# Patient Record
Sex: Female | Born: 1970 | Race: White | Hispanic: No | Smoking: Current every day smoker
Health system: Southern US, Community
[De-identification: ages and names within clinical notes are randomized; demographics above are authoritative.]

## PROBLEM LIST (undated history)

## (undated) DIAGNOSIS — K5792 Diverticulitis of intestine, part unspecified, without perforation or abscess without bleeding: Secondary | ICD-10-CM

## (undated) DIAGNOSIS — G510 Bell's palsy: Secondary | ICD-10-CM

## (undated) HISTORY — PX: CHOLECYSTECTOMY: SHX55

## (undated) HISTORY — PX: ABDOMINAL HYSTERECTOMY: SHX81

---

## 2004-10-07 ENCOUNTER — Ambulatory Visit: Payer: Self-pay | Admitting: Family Medicine

## 2009-03-15 ENCOUNTER — Ambulatory Visit (HOSPITAL_COMMUNITY): Admission: RE | Admit: 2009-03-15 | Discharge: 2009-03-15 | Payer: Self-pay | Admitting: Obstetrics and Gynecology

## 2009-04-29 ENCOUNTER — Ambulatory Visit (HOSPITAL_COMMUNITY): Admission: RE | Admit: 2009-04-29 | Discharge: 2009-04-29 | Payer: Self-pay | Admitting: Obstetrics and Gynecology

## 2009-05-23 ENCOUNTER — Ambulatory Visit (HOSPITAL_COMMUNITY): Admission: RE | Admit: 2009-05-23 | Discharge: 2009-05-23 | Payer: Self-pay | Admitting: Obstetrics and Gynecology

## 2009-06-20 ENCOUNTER — Ambulatory Visit (HOSPITAL_COMMUNITY): Admission: RE | Admit: 2009-06-20 | Discharge: 2009-06-20 | Payer: Self-pay | Admitting: Obstetrics and Gynecology

## 2009-07-18 ENCOUNTER — Ambulatory Visit (HOSPITAL_COMMUNITY): Admission: RE | Admit: 2009-07-18 | Discharge: 2009-07-18 | Payer: Self-pay | Admitting: Obstetrics and Gynecology

## 2010-03-05 ENCOUNTER — Encounter: Payer: Self-pay | Admitting: Obstetrics and Gynecology

## 2016-03-06 ENCOUNTER — Emergency Department: Payer: Self-pay

## 2016-03-06 ENCOUNTER — Encounter: Payer: Self-pay | Admitting: Emergency Medicine

## 2016-03-06 ENCOUNTER — Emergency Department
Admission: EM | Admit: 2016-03-06 | Discharge: 2016-03-06 | Disposition: A | Discharge status: Home / Self Care | Payer: Self-pay | Attending: Emergency Medicine | Admitting: Emergency Medicine

## 2016-03-06 ENCOUNTER — Emergency Department
Admission: EM | Admit: 2016-03-06 | Discharge: 2016-03-07 | Disposition: A | Discharge status: Home / Self Care | Payer: Medicaid Other | Attending: Emergency Medicine | Admitting: Emergency Medicine

## 2016-03-06 DIAGNOSIS — R42 Dizziness and giddiness: Secondary | ICD-10-CM | POA: Insufficient documentation

## 2016-03-06 DIAGNOSIS — R0789 Other chest pain: Secondary | ICD-10-CM | POA: Insufficient documentation

## 2016-03-06 DIAGNOSIS — F17210 Nicotine dependence, cigarettes, uncomplicated: Secondary | ICD-10-CM | POA: Insufficient documentation

## 2016-03-06 DIAGNOSIS — R079 Chest pain, unspecified: Secondary | ICD-10-CM | POA: Insufficient documentation

## 2016-03-06 HISTORY — DX: Diverticulitis of intestine, part unspecified, without perforation or abscess without bleeding: K57.92

## 2016-03-06 LAB — COMPREHENSIVE METABOLIC PANEL
ALT: 17 U/L (ref 14–54)
ANION GAP: 5 (ref 5–15)
AST: 16 U/L (ref 15–41)
Albumin: 3.8 g/dL (ref 3.5–5.0)
Alkaline Phosphatase: 46 U/L (ref 38–126)
BUN: 14 mg/dL (ref 6–20)
CALCIUM: 8.7 mg/dL — AB (ref 8.9–10.3)
CHLORIDE: 108 mmol/L (ref 101–111)
CO2: 27 mmol/L (ref 22–32)
Creatinine, Ser: 0.69 mg/dL (ref 0.44–1.00)
GFR calc non Af Amer: >60 mL/min (ref >60)
Glucose, Bld: 95 mg/dL (ref 65–99)
Potassium: 3.9 mmol/L (ref 3.5–5.1)
SODIUM: 140 mmol/L (ref 135–145)
Total Bilirubin: 0.6 mg/dL (ref 0.3–1.2)
Total Protein: 6.3 g/dL — ABNORMAL LOW (ref 6.5–8.1)

## 2016-03-06 LAB — BASIC METABOLIC PANEL
ANION GAP: 7 (ref 5–15)
BUN: 14 mg/dL (ref 6–20)
CO2: 28 mmol/L (ref 22–32)
Calcium: 9 mg/dL (ref 8.9–10.3)
Chloride: 105 mmol/L (ref 101–111)
Creatinine, Ser: 0.8 mg/dL (ref 0.44–1.00)
GLUCOSE: 103 mg/dL — AB (ref 65–99)
POTASSIUM: 3 mmol/L — AB (ref 3.5–5.1)
Sodium: 140 mmol/L (ref 135–145)

## 2016-03-06 LAB — PROTIME-INR
INR: 1.01
Prothrombin Time: 13.3 seconds (ref 11.4–15.2)

## 2016-03-06 LAB — BRAIN NATRIURETIC PEPTIDE: B NATRIURETIC PEPTIDE 5: 14 pg/mL (ref 0.0–100.0)

## 2016-03-06 LAB — CBC
HCT: 40.1 % (ref 35.0–47.0)
HEMATOCRIT: 41.9 % (ref 35.0–47.0)
HEMOGLOBIN: 14.7 g/dL (ref 12.0–16.0)
Hemoglobin: 14 g/dL (ref 12.0–16.0)
MCH: 31.4 pg (ref 26.0–34.0)
MCH: 31.8 pg (ref 26.0–34.0)
MCHC: 34.9 g/dL (ref 32.0–36.0)
MCHC: 35.1 g/dL (ref 32.0–36.0)
MCV: 90.2 fL (ref 80.0–100.0)
MCV: 90.6 fL (ref 80.0–100.0)
PLATELETS: 166 10*3/uL (ref 150–440)
Platelets: 179 10*3/uL (ref 150–440)
RBC: 4.44 MIL/uL (ref 3.80–5.20)
RBC: 4.63 MIL/uL (ref 3.80–5.20)
RDW: 13.3 % (ref 11.5–14.5)
RDW: 13.2 % (ref 11.5–14.5)
WBC: 5.4 10*3/uL (ref 3.6–11.0)
WBC: 7.5 10*3/uL (ref 3.6–11.0)

## 2016-03-06 LAB — FIBRIN DERIVATIVES D-DIMER (ARMC ONLY): FIBRIN DERIVATIVES D-DIMER (ARMC): 300 (ref 0–499)

## 2016-03-06 LAB — TROPONIN I: Troponin I: <0.03 ng/mL (ref <0.03)

## 2016-03-06 LAB — APTT: APTT: 30 s (ref 24–36)

## 2016-03-06 MED ORDER — ONDANSETRON HCL 4 MG/2ML IJ SOLN
4.0000 mg | Freq: Once | INTRAMUSCULAR | Status: AC
  Administered 2016-03-06: 4 mg via INTRAVENOUS
  Filled 2016-03-06: qty 2

## 2016-03-06 MED ORDER — ONDANSETRON 4 MG PO TBDP: 4.0000 mg | ORAL_TABLET | Freq: Three times a day (TID) | ORAL | 0 refills | Status: DC | PRN

## 2016-03-06 MED ORDER — IOPAMIDOL (ISOVUE-370) INJECTION 76%
100.0000 mL | Freq: Once | INTRAVENOUS | Status: AC | PRN
  Administered 2016-03-06: 100 mL via INTRAVENOUS

## 2016-03-06 MED ORDER — MORPHINE SULFATE (PF) 2 MG/ML IV SOLN
2.0000 mg | Freq: Once | INTRAVENOUS | Status: AC
  Administered 2016-03-06: 2 mg via INTRAVENOUS
  Filled 2016-03-06: qty 1

## 2016-03-06 MED ORDER — NITROGLYCERIN 0.4 MG SL SUBL
0.4000 mg | SUBLINGUAL_TABLET | Freq: Once | SUBLINGUAL | Status: AC
Start: 2016-03-06 — End: 2016-03-06
  Administered 2016-03-06: 0.4 mg via SUBLINGUAL
  Filled 2016-03-06: qty 1

## 2016-03-06 MED ORDER — KETOROLAC TROMETHAMINE 30 MG/ML IJ SOLN
30.0000 mg | Freq: Once | INTRAMUSCULAR | Status: AC
  Administered 2016-03-06: 30 mg via INTRAVENOUS
  Filled 2016-03-06: qty 1

## 2016-03-06 NOTE — ED Provider Notes (Signed)
[facility] Emergency Department Provider Note   ____________________________________________    I have reviewed the triage vital signs and the nursing notes.   HISTORY  Chief Complaint Chest Pain     HPI Hannah Bowman is a 46 y.o. female presented with right-sided upper chest pain which radiates down her right arm and into her neck. She reports this started proximal leg one hour ago at work. She then developed nausea and vomiting thereafter. She reports a strong family history of heart disease. She denies a history of high blood pressure, she does smoke cigarettes. Worsening pain with deep inspiration. No recent travel. No Swelling. No history of blood clots.   Past Medical History:  Diagnosis Date  . Diverticulitis     There are no active problems to display for this patient.   Past Surgical History:  Procedure Laterality Date  . ABDOMINAL HYSTERECTOMY    . CESAREAN SECTION    . CHOLECYSTECTOMY      Prior to Admission medications   Medication Sig Start Date End Date Taking? Authorizing Provider  cholecalciferol (VITAMIN D) 400 units TABS tablet Take 400 Units by mouth daily.   Yes Historical Provider, MD     Allergies Patient has no known allergies.  No family history on file.  Social History Social History  Substance Use Topics  . Smoking status: Current Every Day Smoker    Packs/day: 1.00    Types: Cigarettes  . Smokeless tobacco: Never Used  . Alcohol use No    Review of Systems  Constitutional: No fever/chills Eyes: No visual changes.   Cardiovascular: As above Respiratory: Denies shortness of breath. Positive pleurisy Gastrointestinal: No abdominal pain.  No nausea, no vomiting.    Musculoskeletal: Negative for back pain. Skin: Negative for rash. Neurological: Negative for headaches   10-point ROS otherwise negative.  ____________________________________________   PHYSICAL EXAM:  VITAL SIGNS: ED  Triage Vitals  Enc Vitals Group     BP      Pulse      Resp      Temp      Temp src      SpO2      Weight      Height      Head Circumference      Peak Flow      Pain Score      Pain Loc      Pain Edu?      Excl. in GC?     Constitutional: Alert and oriented.Uncomfortable but pleasant and interactive Eyes: Conjunctivae are normal.   Nose: No congestion/rhinnorhea. Mouth/Throat: Mucous membranes are moist.   Neck:  Painless ROM Cardiovascular: Normal rate, regular rhythm. Grossly normal heart sounds.  Good peripheral circulation. Respiratory: Normal respiratory effort.  No retractions. Lungs CTAB. Gastrointestinal: Soft and nontender. No distention.  No CVA tenderness. Genitourinary: deferred Musculoskeletal: No lower extremity tenderness nor edema.  Warm and well perfused Neurologic:  Normal speech and language. No gross focal neurologic deficits are appreciated.  Skin:  Skin is warm, dry and intact. No rash noted. Psychiatric: Mood and affect are normal. Speech and behavior are normal.  ____________________________________________   LABS (all labs ordered are listed, but only abnormal results are displayed)  Labs Reviewed  COMPREHENSIVE METABOLIC PANEL - Abnormal; Notable for the following:       Result Value   Calcium 8.7 (*)    Total Protein 6.3 (*)    All other components within normal limits  CBC  TROPONIN I  BRAIN NATRIURETIC PEPTIDE  FIBRIN DERIVATIVES D-DIMER ([facility] ONLY)  PROTIME-INR  APTT  TROPONIN I   ____________________________________________  EKG  ED ECG REPORT I, Jene Every, the attending physician, personally viewed and interpreted this ECG.  Date: 03/06/2016 EKG Time: 10:18 AM Rate: 63  Rhythm: normal sinus rhythm QRS Axis: normal Intervals: normal ST/T Wave abnormalities: normal Conduction Disturbances: none Narrative Interpretation: unremarkable  ____________________________________________  RADIOLOGY  Normal chest  x-ray ____________________________________________   PROCEDURES  Procedure(s) performed: No    Critical Care performed: No ____________________________________________   INITIAL IMPRESSION / ASSESSMENT AND PLAN / ED COURSE  Pertinent labs & imaging results that were available during my care of the patient were reviewed by me and considered in my medical decision making (see chart for details).  Patient presents with right sided upper chest pain radiating to right arm followed by nausea and vomiting and pleurisy. Differential includes ACS, PE, dissection, musculoskeletal pain, pneumonia     ----------------------------------------- 2:53 PM on 03/06/2016 ----------------------------------------- Patient reports she is feeling significantly better. Her CT angio is unremarkable. We will check a second troponin and if negative feel she is appropriate for discharge with close follow-up with cardiology. She knows to avoid exertion until cleared by cardiology. Return precautions discussed at length.  ____________________________________________   FINAL CLINICAL IMPRESSION(S) / ED DIAGNOSES  Final diagnoses:  Atypical chest pain      NEW MEDICATIONS STARTED DURING THIS VISIT:  New Prescriptions   No medications on file     Note:  This document was prepared using Dragon voice recognition software and may include unintentional dictation errors.    Jene Every, MD 03/06/16 1538

## 2016-03-06 NOTE — ED Triage Notes (Signed)
Pt presents to ED via wheelchair with c/o RIGHT and MID chest pain accompanied by dizziness and headache. Pt reports was seen here this morning, results were negative. Pt states pain became more intense tonight. Pt denies shortness of breath, nausea, vomiting, or back pain. Pt alert and oriented x 4, respirations even and unlabored. Skin warm and dry.

## 2016-03-06 NOTE — ED Triage Notes (Signed)
Per [facility], patient here from work after c/o left sided CP, right arm pain and right jaw pain 1 hour PTA. Patient also c/o nausea with 1 episode of vomiting 30 mins PTA. Patient A&O x4. Ambulatory on scene. No cardiac history noted. EMS reports SB with a rate of 42. Currently patient SB rate of 59 on monitor. Patient still reports 8/10 CP.

## 2016-03-07 LAB — TROPONIN I: Troponin I: <0.03 ng/mL (ref <0.03)

## 2016-03-07 MED ORDER — TRAMADOL HCL 50 MG PO TABS: 50.0000 mg | ORAL_TABLET | Freq: Four times a day (QID) | ORAL | 0 refills | Status: DC | PRN

## 2016-03-07 MED ORDER — SODIUM CHLORIDE 0.9 % IV BOLUS (SEPSIS)
1000.0000 mL | Freq: Once | INTRAVENOUS | Status: AC
  Administered 2016-03-07: 1000 mL via INTRAVENOUS

## 2016-03-07 MED ORDER — TRAMADOL HCL 50 MG PO TABS
50.0000 mg | ORAL_TABLET | Freq: Once | ORAL | Status: AC
  Administered 2016-03-07: 50 mg via ORAL
  Filled 2016-03-07: qty 1

## 2016-03-07 MED ORDER — POTASSIUM CHLORIDE CRYS ER 20 MEQ PO TBCR
40.0000 meq | EXTENDED_RELEASE_TABLET | Freq: Once | ORAL | Status: AC
  Administered 2016-03-07: 40 meq via ORAL
  Filled 2016-03-07: qty 2

## 2016-03-07 NOTE — ED Provider Notes (Signed)
[facility] Emergency Department Provider Note   ____________________________________________   First MD Initiated Contact with Patient 03/06/16 2349     (approximate)  I have reviewed the triage vital signs and the nursing notes.   HISTORY  Chief Complaint Chest Pain    HPI Hannah NevinsHeather B Jelinski is a 46 y.o. female who comes into the hospital today with some chest pain. The patient was actually seen here in the emergency department earlier. She was told that if her symptoms came back and was worse she should come back into the hospital. She had some right-sided chest pain and she reports she became dizzy and lightheaded. Her hands were tingling like they were asleep when she was walking. She reports that the dull pain when she lays on her back. The patient denies any shortness of breath. She reports that the pain is worse with deep inspiration and she rates her pain a 6 out of 10 in intensity. The patient has never had this pain before. She vomited earlier and has had some nausea. The patient has back here this evening for further evaluation of these symptoms.   Past Medical History:  Diagnosis Date  . Diverticulitis     There are no active problems to display for this patient.   Past Surgical History:  Procedure Laterality Date  . ABDOMINAL HYSTERECTOMY    . CESAREAN SECTION    . CHOLECYSTECTOMY      Prior to Admission medications   Medication Sig Start Date End Date Taking? Authorizing Provider  cholecalciferol (VITAMIN D) 400 units TABS tablet Take 400 Units by mouth daily.    Historical Provider, MD  ondansetron (ZOFRAN ODT) 4 MG disintegrating tablet Take 1 tablet (4 mg total) by mouth every 8 (eight) hours as needed for nausea or vomiting. 03/06/16   Jene Everyobert Kinner, MD  traMADol (ULTRAM) 50 MG tablet Take 1 tablet (50 mg total) by mouth every 6 (six) hours as needed. 03/07/16   Rebecka ApleyAllison P Harlynn Kimbell, MD    Allergies Iodine  No family history on  file.  Social History Social History  Substance Use Topics  . Smoking status: Current Every Day Smoker    Packs/day: 1.00    Types: Cigarettes  . Smokeless tobacco: Never Used  . Alcohol use No    Review of Systems Constitutional: No fever/chills Eyes: No visual changes. ENT: No sore throat. Cardiovascular:  chest pain. Respiratory: Denies shortness of breath. Gastrointestinal: Nausea and vomiting No abdominal pain.  No diarrhea.  No constipation. Genitourinary: Negative for dysuria. Musculoskeletal: Negative for back pain. Skin: Negative for rash. Neurological: Dizziness  10-point ROS otherwise negative.  ____________________________________________   PHYSICAL EXAM:  VITAL SIGNS: ED Triage Vitals  Enc Vitals Group     BP 03/06/16 2136 103/65     Pulse Rate 03/06/16 2136 (!) 57     Resp 03/06/16 2136 18     Temp 03/06/16 2136 97.5 F (36.4 C)     Temp Source 03/06/16 2136 Oral     SpO2 03/06/16 2136 99 %     Weight 03/06/16 2136 240 lb (108.9 kg)     Height 03/06/16 2136 5\' 4"  (1.626 m)     Head Circumference --      Peak Flow --      Pain Score 03/06/16 2134 10     Pain Loc --      Pain Edu? --      Excl. in GC? --     Constitutional: Alert  and oriented. Well appearing and in Mild distress. Eyes: Conjunctivae are normal. PERRL. EOMI. Head: Atraumatic. Nose: No congestion/rhinnorhea. Mouth/Throat: Mucous membranes are moist.  Oropharynx non-erythematous. Cardiovascular: Normal rate, regular rhythm. Grossly normal heart sounds.  Good peripheral circulation. Respiratory: Normal respiratory effort.  No retractions. Lungs CTAB. Right chest tender to palpation Gastrointestinal: Soft and nontender. No distention. Positive bowel sounds Musculoskeletal: No lower extremity tenderness nor edema.   Neurologic:  Normal speech and language.  Skin:  Skin is warm, dry and intact.  Psychiatric: Mood and affect are normal.    ____________________________________________   LABS (all labs ordered are listed, but only abnormal results are displayed)  Labs Reviewed  BASIC METABOLIC PANEL - Abnormal; Notable for the following:       Result Value   Potassium 3.0 (*)    Glucose, Bld 103 (*)    All other components within normal limits  CBC  TROPONIN I  TROPONIN I   ____________________________________________  EKG  ED ECG REPORT I, Rebecka Apley, the attending physician, personally viewed and interpreted this ECG.   Date: 03/06/2016  EKG Time: 2136  Rate: 51  Rhythm: sinus bradycardia  Axis: normal  Intervals:none  ST&T Change: none  ____________________________________________  RADIOLOGY  none ____________________________________________   PROCEDURES  Procedure(s) performed: None  Procedures  Critical Care performed: No  ____________________________________________   INITIAL IMPRESSION / ASSESSMENT AND PLAN / ED COURSE  Pertinent labs & imaging results that were available during my care of the patient were reviewed by me and considered in my medical decision making (see chart for details).  This is a 46 year old female who comes into the hospital today with some right-sided chest pain. The patient was seen in the hospital earlier today for the same symptoms. She did receive a CT angios of her chest as well as a chest x-ray and a CT angiogram of her neck. The patient had no PE no dissection had some atelectasis on the right lung but nothing else. The patient's blood work earlier was also unremarkable. I did give the patient a dose of tramadol for her pain. She was sleeping while she was in the emergency department. I checked to cardiac enzymes and they were negative. At this point the patient had a workup earlier as well as a workup at this time and I'm unsure the cause of her symptoms. I encouraged patient to follow-up with cardiology for further evaluation. The patient will be  discharged home.      ____________________________________________   FINAL CLINICAL IMPRESSION(S) / ED DIAGNOSES  Final diagnoses:  Chest pain, unspecified type      NEW MEDICATIONS STARTED DURING THIS VISIT:  New Prescriptions   TRAMADOL (ULTRAM) 50 MG TABLET    Take 1 tablet (50 mg total) by mouth every 6 (six) hours as needed.     Note:  This document was prepared using Dragon voice recognition software and may include unintentional dictation errors.    Rebecka Apley, MD 03/07/16 [phone]

## 2016-03-22 ENCOUNTER — Observation Stay
Admission: EM | Admit: 2016-03-22 | Discharge: 2016-03-23 | Disposition: A | Discharge status: Home / Self Care | Payer: Self-pay | Attending: Internal Medicine | Admitting: Internal Medicine

## 2016-03-22 ENCOUNTER — Emergency Department: Payer: Self-pay

## 2016-03-22 DIAGNOSIS — Z90710 Acquired absence of both cervix and uterus: Secondary | ICD-10-CM | POA: Insufficient documentation

## 2016-03-22 DIAGNOSIS — E669 Obesity, unspecified: Secondary | ICD-10-CM | POA: Insufficient documentation

## 2016-03-22 DIAGNOSIS — R42 Dizziness and giddiness: Secondary | ICD-10-CM

## 2016-03-22 DIAGNOSIS — Z7982 Long term (current) use of aspirin: Secondary | ICD-10-CM | POA: Insufficient documentation

## 2016-03-22 DIAGNOSIS — Z79899 Other long term (current) drug therapy: Secondary | ICD-10-CM | POA: Insufficient documentation

## 2016-03-22 DIAGNOSIS — R0789 Other chest pain: Principal | ICD-10-CM | POA: Insufficient documentation

## 2016-03-22 DIAGNOSIS — R001 Bradycardia, unspecified: Secondary | ICD-10-CM

## 2016-03-22 DIAGNOSIS — R079 Chest pain, unspecified: Secondary | ICD-10-CM | POA: Diagnosis present

## 2016-03-22 DIAGNOSIS — Z6838 Body mass index (BMI) 38.0-38.9, adult: Secondary | ICD-10-CM | POA: Insufficient documentation

## 2016-03-22 DIAGNOSIS — Z91041 Radiographic dye allergy status: Secondary | ICD-10-CM | POA: Insufficient documentation

## 2016-03-22 DIAGNOSIS — Z9049 Acquired absence of other specified parts of digestive tract: Secondary | ICD-10-CM | POA: Insufficient documentation

## 2016-03-22 DIAGNOSIS — E785 Hyperlipidemia, unspecified: Secondary | ICD-10-CM | POA: Insufficient documentation

## 2016-03-22 DIAGNOSIS — Z8249 Family history of ischemic heart disease and other diseases of the circulatory system: Secondary | ICD-10-CM | POA: Insufficient documentation

## 2016-03-22 DIAGNOSIS — F17210 Nicotine dependence, cigarettes, uncomplicated: Secondary | ICD-10-CM | POA: Insufficient documentation

## 2016-03-22 LAB — CBC
HEMATOCRIT: 42.2 % (ref 35.0–47.0)
Hemoglobin: 14.4 g/dL (ref 12.0–16.0)
MCH: 31 pg (ref 26.0–34.0)
MCHC: 34.1 g/dL (ref 32.0–36.0)
MCV: 91.1 fL (ref 80.0–100.0)
PLATELETS: 174 10*3/uL (ref 150–440)
RBC: 4.63 MIL/uL (ref 3.80–5.20)
RDW: 13.2 % (ref 11.5–14.5)
WBC: 5.1 10*3/uL (ref 3.6–11.0)

## 2016-03-22 LAB — COMPREHENSIVE METABOLIC PANEL
ALBUMIN: 4.2 g/dL (ref 3.5–5.0)
ALT: 16 U/L (ref 14–54)
ANION GAP: 7 (ref 5–15)
AST: 17 U/L (ref 15–41)
Alkaline Phosphatase: 50 U/L (ref 38–126)
BILIRUBIN TOTAL: 0.7 mg/dL (ref 0.3–1.2)
BUN: 16 mg/dL (ref 6–20)
CHLORIDE: 104 mmol/L (ref 101–111)
CO2: 28 mmol/L (ref 22–32)
Calcium: 9 mg/dL (ref 8.9–10.3)
Creatinine, Ser: 0.79 mg/dL (ref 0.44–1.00)
GFR calc Af Amer: >60 mL/min (ref >60)
GLUCOSE: 97 mg/dL (ref 65–99)
POTASSIUM: 3.8 mmol/L (ref 3.5–5.1)
Sodium: 139 mmol/L (ref 135–145)
TOTAL PROTEIN: 7.1 g/dL (ref 6.5–8.1)

## 2016-03-22 LAB — TROPONIN I

## 2016-03-22 MED ORDER — ASPIRIN EC 81 MG PO TBEC
81.0000 mg | DELAYED_RELEASE_TABLET | Freq: Every day | ORAL | Status: DC
  Administered 2016-03-23: 81 mg via ORAL
  Filled 2016-03-22: qty 1

## 2016-03-22 MED ORDER — ACETAMINOPHEN 650 MG RE SUPP: 650.0000 mg | Freq: Four times a day (QID) | RECTAL | Status: DC | PRN

## 2016-03-22 MED ORDER — INFLUENZA VAC SPLIT QUAD 0.5 ML IM SUSY
0.5000 mL | PREFILLED_SYRINGE | INTRAMUSCULAR | Status: AC
  Administered 2016-03-23: 0.5 mL via INTRAMUSCULAR
  Filled 2016-03-22: qty 0.5

## 2016-03-22 MED ORDER — NITROGLYCERIN 0.4 MG SL SUBL
0.4000 mg | SUBLINGUAL_TABLET | SUBLINGUAL | Status: DC | PRN
  Administered 2016-03-22: 0.4 mg via SUBLINGUAL

## 2016-03-22 MED ORDER — NITROGLYCERIN 2 % TD OINT
0.5000 [in_us] | TOPICAL_OINTMENT | Freq: Four times a day (QID) | TRANSDERMAL | Status: DC
  Administered 2016-03-23 (×3): 0.5 [in_us] via TOPICAL
  Filled 2016-03-22 (×3): qty 1

## 2016-03-22 MED ORDER — REGADENOSON 0.4 MG/5ML IV SOLN
0.4000 mg | Freq: Once | INTRAVENOUS | Status: DC
  Filled 2016-03-22: qty 5

## 2016-03-22 MED ORDER — BISACODYL 5 MG PO TBEC
5.0000 mg | DELAYED_RELEASE_TABLET | Freq: Every day | ORAL | Status: DC | PRN
  Filled 2016-03-22: qty 1

## 2016-03-22 MED ORDER — ASPIRIN EC 81 MG PO TBEC: 81.0000 mg | DELAYED_RELEASE_TABLET | Freq: Every day | ORAL | Status: DC

## 2016-03-22 MED ORDER — ONDANSETRON HCL 4 MG/2ML IJ SOLN: 4.0000 mg | Freq: Four times a day (QID) | INTRAMUSCULAR | Status: DC | PRN

## 2016-03-22 MED ORDER — ONDANSETRON HCL 4 MG PO TABS: 4.0000 mg | ORAL_TABLET | Freq: Four times a day (QID) | ORAL | Status: DC | PRN

## 2016-03-22 MED ORDER — HEPARIN SODIUM (PORCINE) 5000 UNIT/ML IJ SOLN
5000.0000 [IU] | Freq: Three times a day (TID) | INTRAMUSCULAR | Status: DC
  Administered 2016-03-22 – 2016-03-23 (×2): 5000 [IU] via SUBCUTANEOUS
  Filled 2016-03-22 (×2): qty 1

## 2016-03-22 MED ORDER — DOCUSATE SODIUM 100 MG PO CAPS
100.0000 mg | ORAL_CAPSULE | Freq: Two times a day (BID) | ORAL | Status: DC
  Administered 2016-03-23: 100 mg via ORAL
  Filled 2016-03-22: qty 1

## 2016-03-22 MED ORDER — TRAZODONE HCL 50 MG PO TABS: 25.0000 mg | ORAL_TABLET | Freq: Every evening | ORAL | Status: DC | PRN

## 2016-03-22 MED ORDER — NITROGLYCERIN 0.4 MG SL SUBL
SUBLINGUAL_TABLET | SUBLINGUAL | Status: AC
  Administered 2016-03-22: 0.4 mg via SUBLINGUAL
  Filled 2016-03-22: qty 1

## 2016-03-22 MED ORDER — ACETAMINOPHEN 325 MG PO TABS
650.0000 mg | ORAL_TABLET | Freq: Four times a day (QID) | ORAL | Status: DC | PRN
  Administered 2016-03-23: 650 mg via ORAL
  Filled 2016-03-22: qty 2

## 2016-03-22 NOTE — ED Notes (Signed)
Per pt she accidentally pulled out her iv while sleeping - iv being established

## 2016-03-22 NOTE — ED Notes (Signed)
Report received - pt is being admitted for her continued chest pain - no bed assigned at this time. vss.

## 2016-03-22 NOTE — ED Provider Notes (Addendum)
[facility] Emergency Department Provider Note  ____________________________________________  Time seen: Approximately 11:59 AM  I have reviewed the triage vital signs and the nursing notes.   HISTORY  Chief Complaint Chest Pain    HPI Hannah Bowman is a 46 y.o. female with obesity and ongoing tobacco abuse presenting with chest pain. 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 The sensation then developed into a "stabbing" sensation. The patient was given 4 AB aspirin by EMS and the pain resolved afterwards. She had associated lightheadedness but no shortness of breath, palpitations, syncope, diaphoresis, nausea or vomiting. The patient reports that she has been having similar symptoms, occasionally with exertion but often not, not associated with food, every other day for the past several weeks. She has been previously evaluated in the emergency department with a reassuring workup, but has not yet seen a cardiologist due to a referral needed from her PMD.  Sh: Positive tobacco negative cocaine FH: Both mother and father with early CAD and MI in their 63s.   Past Medical History:  Diagnosis Date  . Diverticulitis     There are no active problems to display for this patient.   Past Surgical History:  Procedure Laterality Date  . ABDOMINAL HYSTERECTOMY    . CESAREAN SECTION    . CHOLECYSTECTOMY      Current Outpatient Rx  . Order #: [id number] Class: Historical Med  . Order #: [id number] Class: Print  . Order #: [id number] Class: Print    Allergies Iodine  History reviewed. No pertinent family history.  Social History Social History  Substance Use Topics  . Smoking status: Current Every Day Smoker    Packs/day: 1.00    Types: Cigarettes  . Smokeless tobacco: Never Used  . Alcohol use No    Review of  Systems Constitutional: No fever/chills.Positive lightheadedness without syncope. Eyes: No visual changes. ENT: No sore throat. No congestion or rhinorrhea. Cardiovascular: Positive chest pain. Denies palpitations. Respiratory: Denies shortness of breath.  No cough. Gastrointestinal: No abdominal pain.  No nausea, no vomiting.  No diarrhea.  No constipation. Genitourinary: Negative for dysuria. Musculoskeletal: Negative for back pain. Skin: Negative for rash. Neurological: Negative for headaches. No focal numbness, tingling or weakness.   10-point ROS otherwise negative.  ____________________________________________   PHYSICAL EXAM:  VITAL SIGNS: ED Triage Vitals [03/22/16 1136]  Enc Vitals Group     BP 111/69     Pulse Rate (!) 52     Resp 17     Temp 97.8 F (36.6 C)     Temp Source Oral     SpO2 99 %     Weight      Height      Head Circumference      Peak Flow      Pain Score 7     Pain Loc      Pain Edu?      Excl. in GC?     Constitutional: Alert and oriented. Well appearing and in no acute distress. Answers questions appropriately. Eyes: Conjunctivae are normal.  EOMI. No scleral icterus. Head: Atraumatic. Nose: No congestion/rhinnorhea. Mouth/Throat: Mucous membranes are moist.  Neck: No stridor.  Supple.  No JVD. Cardiovascular: Slow rate, regular rhythm. No murmurs, rubs or gallops.  Respiratory: Normal respiratory effort.  No accessory muscle use or retractions. Lungs CTAB.  No  wheezes, rales or ronchi. Gastrointestinal: Obese. Soft, nontender and nondistended.  No guarding or rebound.  No peritoneal signs. Musculoskeletal: No LE edema. No ttp in the calves or palpable cords.  Negative Homan's sign. Neurologic:  A&Ox3.  Speech is clear.  Face and smile are symmetric.  EOMI.  Moves all extremities well. Skin:  Skin is warm, dry and intact. No rash noted. Psychiatric: Depressed mood and flat affect. Speech and behavior are normal.  Normal  judgement.  ____________________________________________   LABS (all labs ordered are listed, but only abnormal results are displayed)  Labs Reviewed  CBC  COMPREHENSIVE METABOLIC PANEL  TROPONIN I   ____________________________________________  EKG  ED ECG REPORT I, Rockne MenghiniNorman, Anne-Caroline, the attending physician, personally viewed and interpreted this ECG.   Date: 03/22/2016  EKG Time: 1134  Rate: 54  Rhythm: sinus bradycardia  Axis: leftward  Intervals:none  ST&T Change: Nonspecific T-wave inversion in V1. No ST elevation.   Repeat EKG during chest pain: ED ECG REPORT I, Rockne MenghiniNorman, Anne-Caroline, the attending physician, personally viewed and interpreted this ECG.   Date: 03/22/2016  EKG Time: 1312  Rate: 53  Rhythm: normal sinus rhythm  Axis: leftward  Intervals:none  ST&T Change: No STEMI.    ____________________________________________  RADIOLOGY  No results found.  ____________________________________________   PROCEDURES  Procedure(s) performed: None  Procedures  Critical Care performed: No ____________________________________________   INITIAL IMPRESSION / ASSESSMENT AND PLAN / ED COURSE  Pertinent labs & imaging results that were available during my care of the patient were reviewed by me and considered in my medical decision making (see chart for details).  46 y.o. female with a strong family history of early CAD, ongoing tobacco abuse and obesity presenting with ongoing episodes of chest pain radiating to the left upper extremity associated with lightheadedness. Overall, the patient has reassuring vital signs although she does have sinus bradycardia without being beta blocked.  This patient's chest pain could have a variety of causes including GI causes. However, given her strong family history and lack of risk stratification studies, all plan to keep her at the hospital for full cardiac rule out with serial troponins and EKGs, cardiac  monitoring, and stress test.  ____________________________________________  FINAL CLINICAL IMPRESSION(S) / ED DIAGNOSES  Final diagnoses:  Chest pain, unspecified type  Lightheadedness  Sinus bradycardia         NEW MEDICATIONS STARTED DURING THIS VISIT:  New Prescriptions   No medications on file      Rockne MenghiniAnne-Caroline Keilee Denman, MD 03/22/16 1203    Rockne MenghiniAnne-Caroline Taryn Nave, MD 03/22/16 1616

## 2016-03-22 NOTE — ED Triage Notes (Signed)
Pt arrives via [facility] for upper central CP and dizziness while standing that started about 5am today. States did radiate to L shoulder but no radiation now. Pt was seen here few weeks ago for same, saw PCP for referral for cardiologist, but has not seen cardiologist yet. Denies vomiting, diarrhea, fevers, sweating. Has family hx of cardiac problems but pt does not have any. Pt takes ASA daily since seen here last, EMS gave 324 ASA en route.

## 2016-03-22 NOTE — H&P (Signed)
[facility] [place] at [facility]   PATIENT NAME: Hannah LukesHeather Bowman    MR#:  [id number]  DATE OF BIRTH:  26-Nov-1970  DATE OF ADMISSION:  03/22/2016  PRIMARY CARE PHYSICIAN: Pricilla HolmSHARPE, LESLIE M, MD   REQUESTING/REFERRING PHYSICIAN: Dr. Sharma CovertNorman  CHIEF COMPLAINT: Chest pain    Chief Complaint  Patient presents with  . Chest Pain    HISTORY OF PRESENT ILLNESS:  Hannah LukesHeather Batrez  is a 46 y.o. female with A past medical history came in because of  midsternal chest pain radiated to the left arm associated with diaphoresis, sweating, nausea this morning when she was at work. Patient took some aspirin at that time, which help her with the pain symptoms subsided by the time she came to ER, in the ER she started to have pain again,ERphysician gave sublingual nitroglycerin which she is Dr. pain. Patient has strong family history of for heart disease, patient's father died at the age of 46. Massive heart attack, mother had hypertension, pacemaker. Patient denies any cough or arm fever.  PAST MEDICAL HISTORY:   Past Medical History:  Diagnosis Date  . Diverticulitis     PAST SURGICAL HISTOIRY:   Past Surgical History:  Procedure Laterality Date  . ABDOMINAL HYSTERECTOMY    . CESAREAN SECTION    . CHOLECYSTECTOMY      SOCIAL HISTORY:   Social History  Substance Use Topics  . Smoking status: Current Every Day Smoker    Packs/day: 1.00    Types: Cigarettes  . Smokeless tobacco: Never Used  . Alcohol use No    FAMILY HISTORY:  History reviewed. No pertinent family history.  DRUG ALLERGIES:   Allergies  Allergen Reactions  . Iodine Hives    REVIEW OF SYSTEMS:  CONSTITUTIONAL: No fever, fatigue or weakness.  EYES: No blurred or double vision.  EARS, NOSE, AND THROAT: No tinnitus or ear pain.  RESPIRATORY: No cough, shortness of breath, wheezing or hemoptysis.  CARDIOVASCULAR: chest pain  with left arm numbness today., orthopnea, edema.   GASTROINTESTINAL: No nausea, vomiting, diarrhea or abdominal pain.  GENITOURINARY: No dysuria, hematuria.  ENDOCRINE: No polyuria, nocturia,  HEMATOLOGY: No anemia, easy bruising or bleeding SKIN: No rash or lesion. MUSCULOSKELETAL: No joint pain or arthritis.   NEUROLOGIC: No tingling, numbness, weakness.  PSYCHIATRY: No anxiety or depression.   MEDICATIONS AT HOME:   Prior to Admission medications   Medication Sig Start Date End Date Taking? Authorizing Provider  aspirin EC 81 MG tablet Take 81 mg by mouth daily.   Yes Historical Provider, MD  cholecalciferol (VITAMIN D) 400 units TABS tablet Take 400 Units by mouth daily.   Yes Historical Provider, MD  ondansetron (ZOFRAN ODT) 4 MG disintegrating tablet Take 1 tablet (4 mg total) by mouth every 8 (eight) hours as needed for nausea or vomiting. Patient not taking: Reported on 03/22/2016 03/06/16   Jene Everyobert Kinner, MD  traMADol (ULTRAM) 50 MG tablet Take 1 tablet (50 mg total) by mouth every 6 (six) hours as needed. Patient not taking: Reported on 03/22/2016 03/07/16   Rebecka ApleyAllison P Webster, MD      VITAL SIGNS:  Blood pressure 105/69, pulse (!) 58, temperature 97.8 F (36.6 C), temperature source Oral, resp. rate 18, SpO2 97 %.  PHYSICAL EXAMINATION:  GENERAL:  46 y.o.-year-old patient lying in the bed with no acute distress.  EYES: Pupils equal, round, reactive to light and accommodation. No scleral icterus. Extraocular muscles intact.  HEENT: Head atraumatic, normocephalic. Oropharynx and nasopharynx  clear.  NECK:  Supple, no jugular venous distention. No thyroid enlargement, no tenderness.  LUNGS: Normal breath sounds bilaterally, no wheezing, rales,rhonchi or crepitation. No use of accessory muscles of respiration.  CARDIOVASCULAR: S1, S2 normal. No murmurs, rubs, or gallops.  ABDOMEN: Soft, nontender, nondistended. Bowel sounds present. No organomegaly or mass.  EXTREMITIES: No pedal edema, cyanosis, or clubbing.  NEUROLOGIC: Cranial  nerves II through XII are intact. Muscle strength 5/5 in all extremities. Sensation intact. Gait not checked.  PSYCHIATRIC: The patient is alert and oriented x 3.  SKIN: No obvious rash, lesion, or ulcer.   LABORATORY PANEL:   CBC  Recent Labs Lab 03/22/16 1135  WBC 5.1  HGB 14.4  HCT 42.2  PLT 174   ------------------------------------------------------------------------------------------------------------------  Chemistries   Recent Labs Lab 03/22/16 1135  NA 139  K 3.8  CL 104  CO2 28  GLUCOSE 97  BUN 16  CREATININE 0.79  CALCIUM 9.0  AST 17  ALT 16  ALKPHOS 50  BILITOT 0.7   ------------------------------------------------------------------------------------------------------------------  Cardiac Enzymes  Recent Labs Lab 03/22/16 1135  TROPONINI <0.03   ------------------------------------------------------------------------------------------------------------------  RADIOLOGY:  Dg Chest 2 View  Result Date: 03/22/2016 CLINICAL DATA:  Right sided chest pain, left arm numbness, dizziness starting this morning. No known cardiopulmonary diseases. Smoker x1ppd. EXAM: CHEST - 2 VIEW COMPARISON:  none FINDINGS: Lungs are clear. Heart size and mediastinal contours are within normal limits. No effusion.  No pneumothorax. Visualized bones unremarkable.   Cholecystectomy clips. IMPRESSION: No acute cardiopulmonary disease. Electronically Signed   By: Corlis Leak M.D.   On: 03/22/2016 13:11    EKG:   Orders placed or performed during the hospital encounter of 03/22/16  . EKG 12-Lead  . EKG 12-Lead  . EKG 12-Lead  . EKG 12-Lead  . ED EKG  . ED EKG   EKG shows sinus bradycardia 54 bpm, no ST T changes. IMPRESSION AND PLAN:  #1 left-sided chest pain with the strong family history of coronary artery disease, chest pain relieved with aspirin, nitroglycerin: Symptoms are concerning for possible ACS or angina: Admitted to hospitalist service under observation, cycle  the troponins , continue aspirin, nitrates, unable to use beta blockers because of bradycardia. Patient told me that she had similar 2 weeks ago and primary doctor advised her to take aspirin, referred her to cardiologist but she did not see the cardiologist yet.check fasting lipids.    All the records are reviewed and case discussed with ED provider. Management plans discussed with the patient, family and they are in agreement.  CODE STATUS: full  TOTAL TIME TAKING CARE OF THIS PATIENT: 55 minutes.    Katha Hamming M.D on 03/22/2016 at 2:30 PM  Between 7am to 6pm - Pager - [phone]  After 6pm go to [email or web address] - password [email or web address] [facility]  [place]  Office  [phone]  CC: Primary care physician; Pricilla Holm, MD  Note: This dictation was prepared with Dragon dictation along with smaller phrase technology. Any transcriptional errors that result from this process are unintentional.

## 2016-03-23 ENCOUNTER — Observation Stay (HOSPITAL_BASED_OUTPATIENT_CLINIC_OR_DEPARTMENT_OTHER): Payer: Self-pay

## 2016-03-23 DIAGNOSIS — R0789 Other chest pain: Secondary | ICD-10-CM

## 2016-03-23 DIAGNOSIS — R079 Chest pain, unspecified: Secondary | ICD-10-CM

## 2016-03-23 LAB — CBC
HCT: 41.7 % (ref 35.0–47.0)
HEMOGLOBIN: 14.1 g/dL (ref 12.0–16.0)
MCH: 31.2 pg (ref 26.0–34.0)
MCHC: 33.9 g/dL (ref 32.0–36.0)
MCV: 91.9 fL (ref 80.0–100.0)
PLATELETS: 170 10*3/uL (ref 150–440)
RBC: 4.54 MIL/uL (ref 3.80–5.20)
RDW: 13.3 % (ref 11.5–14.5)
WBC: 5.9 10*3/uL (ref 3.6–11.0)

## 2016-03-23 LAB — BASIC METABOLIC PANEL
Anion gap: 6 (ref 5–15)
BUN: 22 mg/dL — AB (ref 6–20)
CALCIUM: 8.6 mg/dL — AB (ref 8.9–10.3)
CO2: 28 mmol/L (ref 22–32)
CREATININE: 0.69 mg/dL (ref 0.44–1.00)
Chloride: 107 mmol/L (ref 101–111)
GFR calc Af Amer: >60 mL/min (ref >60)
GFR calc non Af Amer: >60 mL/min (ref >60)
GLUCOSE: 107 mg/dL — AB (ref 65–99)
Potassium: 3.7 mmol/L (ref 3.5–5.1)
Sodium: 141 mmol/L (ref 135–145)

## 2016-03-23 LAB — NM MYOCAR MULTI W/SPECT W/WALL MOTION / EF
CHL CUP NUCLEAR SDS: 2
CHL CUP RESTING HR STRESS: 45 {beats}/min
LV sys vol: 42 mL
LVDIAVOL: 101 mL (ref 46–106)
NUC STRESS TID: 1.19
Peak HR: 104 {beats}/min
Percent HR: 59 %
SRS: 3
SSS: 3

## 2016-03-23 LAB — LIPID PANEL
Cholesterol: 170 mg/dL (ref 0–200)
HDL: 47 mg/dL (ref >40)
LDL Cholesterol: 105 mg/dL — ABNORMAL HIGH (ref 0–99)
Total CHOL/HDL Ratio: 3.6 RATIO
Triglycerides: 89 mg/dL (ref <150)
VLDL: 18 mg/dL (ref 0–40)

## 2016-03-23 LAB — GLUCOSE, CAPILLARY: GLUCOSE-CAPILLARY: 114 mg/dL — AB (ref 65–99)

## 2016-03-23 LAB — TROPONIN I

## 2016-03-23 MED ORDER — REGADENOSON 0.4 MG/5ML IV SOLN
0.4000 mg | Freq: Once | INTRAVENOUS | Status: AC
  Administered 2016-03-23: 0.4 mg via INTRAVENOUS

## 2016-03-23 MED ORDER — TECHNETIUM TC 99M TETROFOSMIN IV KIT
14.0250 | PACK | Freq: Once | INTRAVENOUS | Status: AC | PRN
  Administered 2016-03-23: 14.025 via INTRAVENOUS

## 2016-03-23 MED ORDER — TECHNETIUM TC 99M TETROFOSMIN IV KIT
28.9650 | PACK | Freq: Once | INTRAVENOUS | Status: AC | PRN
  Administered 2016-03-23: 28.965 via INTRAVENOUS

## 2016-03-23 NOTE — Plan of Care (Signed)
Problem: Cardiac: Goal: Ability to achieve and maintain adequate cardiovascular perfusion will improve Outcome: Progressing Pain free this shift.  SB/SR continues on telemetry.

## 2016-03-23 NOTE — Discharge Instructions (Signed)
Heart healthy diet. °Smoking cessation. °

## 2016-03-23 NOTE — Discharge Summary (Signed)
[facility] - [place] at [facility]   PATIENT NAME: Hannah LukesHeather Bowman    MR#:  [id number]  DATE OF BIRTH:  November 22, 1970  DATE OF ADMISSION:  03/22/2016   ADMITTING PHYSICIAN: Katha HammingSnehalatha Konidena, MD  DATE OF DISCHARGE: 03/23/2016 PRIMARY CARE PHYSICIAN: Pricilla HolmSHARPE, LESLIE M, MD   ADMISSION DIAGNOSIS:  Lightheadedness [R42] Sinus bradycardia [R00.1] Chest pain, unspecified type [R07.9] DISCHARGE DIAGNOSIS:  Active Problems:   Chest pain  SECONDARY DIAGNOSIS:   Past Medical History:  Diagnosis Date  . Diverticulitis    HOSPITAL COURSE:   Atypical chest pain with strong family history of CAD. The patient has been treated with aspirin and nitroglycerin when necessary. Stress test is normal.  Hyperlipidemia. LDL 105. I advised the patient exercise and diet control. Tobacco abuse. Smoking cessation was counseled for 3-4 minutes. She wants to quit.  DISCHARGE CONDITIONS:  Stable, discharge to home today. CONSULTS OBTAINED:   DRUG ALLERGIES:   Allergies  Allergen Reactions  . Iodine Hives   DISCHARGE MEDICATIONS:   Allergies as of 03/23/2016      Reactions   Iodine Hives      Medication List    TAKE these medications   aspirin EC 81 MG tablet Take 81 mg by mouth daily.   cholecalciferol 400 units Tabs tablet Commonly known as:  VITAMIN D Take 400 Units by mouth daily.   ondansetron 4 MG disintegrating tablet Commonly known as:  ZOFRAN ODT Take 1 tablet (4 mg total) by mouth every 8 (eight) hours as needed for nausea or vomiting.   traMADol 50 MG tablet Commonly known as:  ULTRAM Take 1 tablet (50 mg total) by mouth every 6 (six) hours as needed.        DISCHARGE INSTRUCTIONS:  See AVS.  If you experience worsening of your admission symptoms, develop shortness of breath, life threatening emergency, suicidal or homicidal thoughts you must seek medical attention immediately by calling 911 or calling your MD immediately  if symptoms less  severe.  You Must read complete instructions/literature along with all the possible adverse reactions/side effects for all the Medicines you take and that have been prescribed to you. Take any new Medicines after you have completely understood and accpet all the possible adverse reactions/side effects.   Please note  You were cared for by a hospitalist during your hospital stay. If you have any questions about your discharge medications or the care you received while you were in the hospital after you are discharged, you can call the unit and asked to speak with the hospitalist on call if the hospitalist that took care of you is not available. Once you are discharged, your primary care physician will handle any further medical issues. Please note that NO REFILLS for any discharge medications will be authorized once you are discharged, as it is imperative that you return to your primary care physician (or establish a relationship with a primary care physician if you do not have one) for your aftercare needs so that they can reassess your need for medications and monitor your lab values.    On the day of Discharge:  VITAL SIGNS:  Blood pressure (!) 94/57, pulse (!) 57, temperature 98.2 F (36.8 C), temperature source Oral, resp. rate 18, height 5\' 4"  (1.626 m), weight 225 lb 3.2 oz (102.2 kg), SpO2 94 %. PHYSICAL EXAMINATION:  GENERAL:  46 y.o.-year-old patient lying in the bed with no acute distress.  EYES: Pupils equal, round, reactive to light and accommodation. No  scleral icterus. Extraocular muscles intact.  HEENT: Head atraumatic, normocephalic. Oropharynx and nasopharynx clear.  NECK:  Supple, no jugular venous distention. No thyroid enlargement, no tenderness.  LUNGS: Normal breath sounds bilaterally, no wheezing, rales,rhonchi or crepitation. No use of accessory muscles of respiration.  CARDIOVASCULAR: S1, S2 normal. No murmurs, rubs, or gallops.  ABDOMEN: Soft, non-tender, non-distended.  Bowel sounds present. No organomegaly or mass.  EXTREMITIES: No pedal edema, cyanosis, or clubbing.  NEUROLOGIC: Cranial nerves II through XII are intact. Muscle strength 5/5 in all extremities. Sensation intact. Gait not checked.  PSYCHIATRIC: The patient is alert and oriented x 3.  SKIN: No obvious rash, lesion, or ulcer.  DATA REVIEW:   CBC  Recent Labs Lab 03/23/16 0336  WBC 5.9  HGB 14.1  HCT 41.7  PLT 170    Chemistries   Recent Labs Lab 03/22/16 1135 03/23/16 0336  NA 139 141  K 3.8 3.7  CL 104 107  CO2 28 28  GLUCOSE 97 107*  BUN 16 22*  CREATININE 0.79 0.69  CALCIUM 9.0 8.6*  AST 17  --   ALT 16  --   ALKPHOS 50  --   BILITOT 0.7  --      Microbiology Results  No results found for this or any previous visit.  RADIOLOGY:  Nm Myocar Multi W/spect W/wall Motion / Ef  Result Date: 03/23/2016  There was no ST segment deviation noted during stress.  No T wave inversion was noted during stress.  Defect 1: There is a small defect of mild severity present in the apex location. This is likely due to breast attenuation artifact.  The study is normal.  This is a low risk study.  The left ventricular ejection fraction is normal (55-65%).      Management plans discussed with the patient, family and they are in agreement.  CODE STATUS:     Code Status Orders        Start     Ordered   03/22/16 1429  Full code  Continuous     03/22/16 1429    Code Status History    Date Active Date Inactive Code Status Order ID Comments User Context   This patient has a current code status but no historical code status.      TOTAL TIME TAKING CARE OF THIS PATIENT: 32 minutes.    Shaune Pollack M.D on 03/23/2016 at 2:22 PM  Between 7am to 6pm - Pager - [phone]  After 6pm go to [email or web address] - Social research officer, government  [facility] [place] [facility]  Office  [phone]  CC: Primary care physician; Pricilla Holm, MD   Note: This dictation was  prepared with Dragon dictation along with smaller phrase technology. Any transcriptional errors that result from this process are unintentional.

## 2016-03-23 NOTE — Discharge Planning (Signed)
Patient IV and tele removed.  Discharge papers given, explained and educated.  Informed of suggested FU appts and appts made.  No scripts needed. Stress test completed prior to DC and final results indicated no needs for concern. RN assessment and VS revealed stability for DC to home.  Once ready, will be wheeled to front and family transporting home via car.

## 2016-03-23 NOTE — Plan of Care (Signed)
Nitro patch removed in prep for stress test

## 2016-08-27 ENCOUNTER — Encounter: Payer: Self-pay | Admitting: Emergency Medicine

## 2016-08-27 ENCOUNTER — Emergency Department
Admission: EM | Admit: 2016-08-27 | Discharge: 2016-08-27 | Disposition: A | Discharge status: Home / Self Care | Payer: Medicaid Other | Attending: Emergency Medicine | Admitting: Emergency Medicine

## 2016-08-27 DIAGNOSIS — F17210 Nicotine dependence, cigarettes, uncomplicated: Secondary | ICD-10-CM | POA: Insufficient documentation

## 2016-08-27 DIAGNOSIS — Z7982 Long term (current) use of aspirin: Secondary | ICD-10-CM | POA: Insufficient documentation

## 2016-08-27 DIAGNOSIS — L02811 Cutaneous abscess of head [any part, except face]: Secondary | ICD-10-CM | POA: Insufficient documentation

## 2016-08-27 MED ORDER — SULFAMETHOXAZOLE-TRIMETHOPRIM 800-160 MG PO TABS
1.0000 | ORAL_TABLET | Freq: Two times a day (BID) | ORAL | 0 refills | Status: DC
Start: 2016-08-27 — End: 2016-10-26

## 2016-08-27 MED ORDER — HYDROCODONE-ACETAMINOPHEN 5-325 MG PO TABS: ORAL_TABLET | ORAL | 0 refills | Status: DC

## 2016-08-27 MED ORDER — HYDROCODONE-ACETAMINOPHEN 5-325 MG PO TABS
2.0000 | ORAL_TABLET | Freq: Once | ORAL | Status: AC
  Administered 2016-08-27: 2 via ORAL
  Filled 2016-08-27: qty 2

## 2016-08-27 MED ORDER — LIDOCAINE HCL (PF) 1 % IJ SOLN
5.0000 mL | Freq: Once | INTRAMUSCULAR | Status: AC
  Administered 2016-08-27: 5 mL

## 2016-08-27 NOTE — ED Triage Notes (Signed)
Abscess to back of neck for over a week.

## 2016-08-27 NOTE — ED Provider Notes (Signed)
[facility] Emergency Department Provider Note  ____________________________________________   First MD Initiated Contact with Patient 08/27/16 1040     (approximate)  I have reviewed the triage vital signs and the nursing notes.   HISTORY  Chief Complaint Abscess    HPI Hannah Bowman is a 46 y.o. female with chief complaint of abscess to the posterior portion of her scalp. Patient has had similar episodes in the past that had to be lanced.Patient denies any nausea, vomiting, fever or chills. She has not taken any over-the-counter medication for her pain this morning and rates it as a 10 over 10.   Past Medical History:  Diagnosis Date  . Diverticulitis     Patient Active Problem List   Diagnosis Date Noted  . Chest pain 03/22/2016    Past Surgical History:  Procedure Laterality Date  . ABDOMINAL HYSTERECTOMY    . CESAREAN SECTION    . CHOLECYSTECTOMY      Prior to Admission medications   Medication Sig Start Date End Date Taking? Authorizing Provider  aspirin EC 81 MG tablet Take 81 mg by mouth daily.    [provider]  cholecalciferol (VITAMIN D) 400 units TABS tablet Take 400 Units by mouth daily.    [provider]  HYDROcodone-acetaminophen (NORCO/VICODIN) 5-325 MG tablet 1 tablet every 4-6 hours as needed for pain. 08/27/16   Tommi Rumps, PA-C  sulfamethoxazole-trimethoprim (BACTRIM DS,SEPTRA DS) 800-160 MG tablet Take 1 tablet by mouth 2 (two) times daily. 08/27/16   Tommi Rumps, PA-C    Allergies Iodine  No family history on file.  Social History Social History  Substance Use Topics  . Smoking status: Current Every Day Smoker    Packs/day: 1.00    Types: Cigarettes  . Smokeless tobacco: Never Used  . Alcohol use No    Review of Systems Constitutional: No fever/chills Cardiovascular: Denies chest pain. Respiratory: Denies shortness of breath. Skin: Positive abscess. Neurological:  Negative for headaches, focal weakness or numbness.   ____________________________________________   PHYSICAL EXAM:  VITAL SIGNS: ED Triage Vitals  Enc Vitals Group     BP 08/27/16 1010 122/79     Pulse Rate 08/27/16 1010 63     Resp 08/27/16 1010 20     Temp 08/27/16 1010 98.1 F (36.7 C)     Temp Source 08/27/16 1010 Oral     SpO2 08/27/16 1010 98 %     Weight 08/27/16 0957 225 lb (102.1 kg)     Height 08/27/16 1010 5\' 4"  (1.626 m)     Head Circumference --      Peak Flow --      Pain Score 08/27/16 0957 10     Pain Loc --      Pain Edu? --      Excl. in GC? --     Constitutional: Alert and oriented. Well appearing and in no acute distress. Eyes: Conjunctivae are normal. PERRL. EOMI. Head: Atraumatic. Neck: No stridor.   Cardiovascular: Normal rate, regular rhythm. Grossly normal heart sounds.  Good peripheral circulation. Respiratory: Normal respiratory effort.  No retractions. Lungs CTAB. Musculoskeletal: Moves upper and lower extremities without difficulty. Normal gait was noted. Neurologic:  Normal speech and language. No gross focal neurologic deficits are appreciated.  Skin:  Skin is warm, dry.   Red nodular with tenderness and fluctuance center at the right lower hairline of the neck. Psychiatric: Mood and affect are normal. Speech and behavior are normal.  ____________________________________________  LABS (all labs ordered are listed, but only abnormal results are displayed)  Labs Reviewed - No data to display   PROCEDURES  Procedure(s) performed: INCISION AND DRAINAGE Performed by: Tommi Rumpshonda L Shaquela Weichert Consent: Verbal consent obtained. Risks and benefits: risks, benefits and alternatives were discussed Type: abscess  Body area: scalp, posterior right.  Anesthesia: local infiltration  Incision was made with a scalpel.  Local anesthetic: lidocaine 1% without epinephrine  Anesthetic total: 1.5 ml  Complexity: complex Blunt dissection to break up  loculations  Drainage: purulent  Drainage amount: minimal  Packing material: 1/4 in iodoform gauze  Patient tolerance: Patient tolerated the procedure well with no immediate complications.    Procedures  Critical Care performed: No  ____________________________________________   INITIAL IMPRESSION / ASSESSMENT AND PLAN / ED COURSE  Pertinent labs & imaging results that were available during my care of the patient were reviewed by me and considered in my medical decision making (see chart for details).  Patient was given Norco prior to procedure.  She was discharged on Bactrim DS and Norco if needed for pain. She is aware that she is to take the antibiotic for the  10 days.  She is follow-up with her PCP in 2 days for packing removal.      ____________________________________________   FINAL CLINICAL IMPRESSION(S) / ED DIAGNOSES  Final diagnoses:  Abscess of scalp      NEW MEDICATIONS STARTED DURING THIS VISIT:  New Prescriptions   HYDROCODONE-ACETAMINOPHEN (NORCO/VICODIN) 5-325 MG TABLET    1 tablet every 4-6 hours as needed for pain.   SULFAMETHOXAZOLE-TRIMETHOPRIM (BACTRIM DS,SEPTRA DS) 800-160 MG TABLET    Take 1 tablet by mouth 2 (two) times daily.     Note:  This document was prepared using Dragon voice recognition software and may include unintentional dictation errors.    Tommi RumpsSummers, Pastor Sgro L, PA-C 08/27/16 1240    Minna AntisPaduchowski, Kevin, MD 08/27/16 [phone]

## 2016-08-27 NOTE — Discharge Instructions (Signed)
Have packing removed by your doctor or urgent care in 2 days. Begin taking antibiotics twice a day until completely finished. Take Norco for pain every 4-6 hours if needed for pain.

## 2016-08-27 NOTE — ED Notes (Signed)
See triage note  States she developed a red raised area to back of neck about 1 week ago    Has been using warm compresses with min relief  States area is larger now and pain is moving into shoulder

## 2016-10-26 ENCOUNTER — Emergency Department: Payer: Self-pay

## 2016-10-26 ENCOUNTER — Emergency Department
Admission: EM | Admit: 2016-10-26 | Discharge: 2016-10-26 | Disposition: A | Discharge status: Home / Self Care | Payer: Self-pay | Attending: Emergency Medicine | Admitting: Emergency Medicine

## 2016-10-26 ENCOUNTER — Encounter: Payer: Self-pay | Admitting: Emergency Medicine

## 2016-10-26 DIAGNOSIS — M778 Other enthesopathies, not elsewhere classified: Secondary | ICD-10-CM

## 2016-10-26 DIAGNOSIS — Z7982 Long term (current) use of aspirin: Secondary | ICD-10-CM | POA: Insufficient documentation

## 2016-10-26 DIAGNOSIS — M25511 Pain in right shoulder: Secondary | ICD-10-CM

## 2016-10-26 DIAGNOSIS — M7531 Calcific tendinitis of right shoulder: Secondary | ICD-10-CM | POA: Insufficient documentation

## 2016-10-26 DIAGNOSIS — F17210 Nicotine dependence, cigarettes, uncomplicated: Secondary | ICD-10-CM | POA: Insufficient documentation

## 2016-10-26 DIAGNOSIS — Z79899 Other long term (current) drug therapy: Secondary | ICD-10-CM | POA: Insufficient documentation

## 2016-10-26 DIAGNOSIS — M7581 Other shoulder lesions, right shoulder: Secondary | ICD-10-CM

## 2016-10-26 MED ORDER — HYDROCODONE-ACETAMINOPHEN 5-325 MG PO TABS: 1.0000 | ORAL_TABLET | Freq: Four times a day (QID) | ORAL | 0 refills | Status: DC | PRN

## 2016-10-26 MED ORDER — KETOROLAC TROMETHAMINE 30 MG/ML IJ SOLN
30.0000 mg | Freq: Once | INTRAMUSCULAR | Status: AC
  Administered 2016-10-26: 30 mg via INTRAMUSCULAR
  Filled 2016-10-26: qty 1

## 2016-10-26 MED ORDER — PREDNISONE 10 MG PO TABS: ORAL_TABLET | ORAL | 0 refills | Status: DC

## 2016-10-26 NOTE — ED Provider Notes (Signed)
[facility] Emergency Department Provider Note   ____________________________________________   First MD Initiated Contact with Patient 10/26/16 [phone]     (approximate)  I have reviewed the triage vital signs and the nursing notes.   HISTORY  Chief Complaint Shoulder Pain   HPI Hannah Bowman is a 46 y.o. female is here complaining of right shoulder pain. Patient states pain started approximately 3 days ago without any history of injury. She states that this morning is significantly worse and pain is increased with any movement of her shoulder. She has taken some ibuprofen with last dose yesterday. She denies any previous injury to her shoulder. She rates her pain as 10 over 10.   Past Medical History:  Diagnosis Date  . Diverticulitis     Patient Active Problem List   Diagnosis Date Noted  . Chest pain 03/22/2016    Past Surgical History:  Procedure Laterality Date  . ABDOMINAL HYSTERECTOMY    . CESAREAN SECTION    . CHOLECYSTECTOMY      Prior to Admission medications   Medication Sig Start Date End Date Taking? Authorizing Provider  aspirin EC 81 MG tablet Take 81 mg by mouth daily.    [provider]  cholecalciferol (VITAMIN D) 400 units TABS tablet Take 400 Units by mouth daily.    [provider]  HYDROcodone-acetaminophen (NORCO/VICODIN) 5-325 MG tablet Take 1 tablet by mouth every 6 (six) hours as needed for moderate pain. 10/26/16   Tommi Rumps, PA-C  predniSONE (DELTASONE) 10 MG tablet Take 3 tablets once a day for 3 days 10/26/16   Tommi Rumps, PA-C    Allergies Iodine  History reviewed. No pertinent family history.  Social History Social History  Substance Use Topics  . Smoking status: Current Every Day Smoker    Packs/day: 1.00    Types: Cigarettes  . Smokeless tobacco: Never Used  . Alcohol use No    Review of Systems Constitutional: No fever/chills Cardiovascular: Denies chest  pain. Respiratory: Denies shortness of breath. Musculoskeletal: Positive for right shoulder pain. Skin: Negative for rash. Neurological: Negative for  focal weakness or numbness. ___________________________________________   PHYSICAL EXAM:  VITAL SIGNS: ED Triage Vitals  Enc Vitals Group     BP 10/26/16 0808 120/79     Pulse Rate 10/26/16 0808 69     Resp 10/26/16 0808 18     Temp 10/26/16 0808 97.8 F (36.6 C)     Temp Source 10/26/16 0808 Oral     SpO2 10/26/16 0808 100 %     Weight 10/26/16 0808 225 lb (102.1 kg)     Height 10/26/16 0808  (1.626 m)     Head Circumference --      Peak Flow --      Pain Score 10/26/16 0807 10     Pain Loc --      Pain Edu? --      Excl. in GC? --    Constitutional: Alert and oriented. Well appearing and in no acute distress. Eyes: Conjunctivae are normal.  Head: Atraumatic. Neck: No stridor.   Cardiovascular: Normal rate, regular rhythm. Grossly normal heart sounds.  Good peripheral circulation. Respiratory: Normal respiratory effort.  No retractions. Lungs CTAB. Musculoskeletal: Examination of the right shoulder there is no gross deformity noted. Range of motion is restricted in all planes secondary to patient's pain. No crepitus was noted. There is marked tenderness on palpation of soft tissues anteriorly. No soft tissue swelling, ecchymosis,  abrasions or erythema noted. Pulses present. Motor sensory function distally is intact. Neurologic:  Normal speech and language. No gross focal neurologic deficits are appreciated.  Skin:  Skin is warm, dry and intact.  Psychiatric: Mood and affect are normal. Speech and behavior are normal.  ____________________________________________   LABS (all labs ordered are listed, but only abnormal results are displayed)  Labs Reviewed - No data to display   RADIOLOGY  Dg Shoulder Right  Result Date: 10/26/2016 CLINICAL DATA:  Woke up with shooting pain down right arm and around humeral head.  No injury. EXAM: RIGHT SHOULDER - 2+ VIEW COMPARISON:  None. FINDINGS: No evidence of acute fracture or dislocation. No focal sclerotic or lytic lesion. Faint oval 1.3 cm density adjacent the humeral head on the internal rotation view which may represent evidence of calcific tendinitis. IMPRESSION: No acute findings. Evidence of possible calcific tendinitis. Electronically Signed   By: Elberta Fortis M.D.   On: 10/26/2016 09:14    ____________________________________________   PROCEDURES  Procedure(s) performed: None  Procedures  Critical Care performed: No  ____________________________________________   INITIAL IMPRESSION / ASSESSMENT AND PLAN / ED COURSE  Pertinent labs & imaging results that were available during my care of the patient were reviewed by me and considered in my medical decision making (see chart for details).  Patient was placed in a sling and she is aware that she should not wear it more than 2-3 days. She was started on a short burst of steroids as patient is having surgery for trigger thumb release in [place] next week. Patient is also given a prescription for Norco as needed for pain. Patient was made aware that she cannot drive or operate machinery while taking this medication. She will follow-up with her orthopedist in [place].   ___________________________________________   FINAL CLINICAL IMPRESSION(S) / ED DIAGNOSES  Final diagnoses:  Acute pain of right shoulder  Shoulder tendonitis, right      NEW MEDICATIONS STARTED DURING THIS VISIT:  Discharge Medication List as of 10/26/2016  9:40 AM    START taking these medications   Details  predniSONE (DELTASONE) 10 MG tablet Take 3 tablets once a day for 3 days, Print         Note:  This document was prepared using Dragon voice recognition software and may include unintentional dictation errors.    Tommi Rumps, PA-C 10/26/16 1022    Schaevitz, Myra Rude, MD 10/26/16 [phone]

## 2016-10-26 NOTE — ED Triage Notes (Signed)
Pt c/o right shoulder pain that started 3 days ago.  Pain goes from shoulder to elbow.  Significantly worse with any movement especially shoulder extension.

## 2016-10-26 NOTE — ED Notes (Addendum)
See triage note  States she developed right shoulder pain w/o injury about 3 days ago   States pain has increased this am  Pain radiates from shoulder into right arm  No deformity noted  Good pulses and sensation

## 2016-10-26 NOTE — Discharge Instructions (Signed)
Follow-up with your orthopedist in [place] if any continued problems. Begin taking prednisone as directed for the next 3 days. Norco one every 6 hours as needed for pain. Do not drive while taking the pain medication. Wear sling for 3 days only. You may also use ice to your shoulder if needed for pain.

## 2017-02-12 HISTORY — PX: BREAST CYST EXCISION: SHX579

## 2017-03-22 ENCOUNTER — Emergency Department
Admission: EM | Admit: 2017-03-22 | Discharge: 2017-03-22 | Disposition: A | Discharge status: Home / Self Care | Payer: Medicaid Other | Attending: Emergency Medicine | Admitting: Emergency Medicine

## 2017-03-22 ENCOUNTER — Other Ambulatory Visit: Payer: Self-pay

## 2017-03-22 ENCOUNTER — Encounter: Payer: Self-pay | Admitting: Emergency Medicine

## 2017-03-22 DIAGNOSIS — Z79899 Other long term (current) drug therapy: Secondary | ICD-10-CM | POA: Diagnosis not present

## 2017-03-22 DIAGNOSIS — F17210 Nicotine dependence, cigarettes, uncomplicated: Secondary | ICD-10-CM | POA: Diagnosis not present

## 2017-03-22 DIAGNOSIS — L02411 Cutaneous abscess of right axilla: Secondary | ICD-10-CM | POA: Diagnosis not present

## 2017-03-22 DIAGNOSIS — L0291 Cutaneous abscess, unspecified: Secondary | ICD-10-CM | POA: Diagnosis present

## 2017-03-22 DIAGNOSIS — Z7982 Long term (current) use of aspirin: Secondary | ICD-10-CM | POA: Insufficient documentation

## 2017-03-22 LAB — COMPREHENSIVE METABOLIC PANEL
ALBUMIN: 3.5 g/dL (ref 3.5–5.0)
ALT: 17 U/L (ref 14–54)
ANION GAP: 6 (ref 5–15)
AST: 18 U/L (ref 15–41)
Alkaline Phosphatase: 40 U/L (ref 38–126)
BUN: 13 mg/dL (ref 6–20)
CHLORIDE: 107 mmol/L (ref 101–111)
CO2: 27 mmol/L (ref 22–32)
Calcium: 8.6 mg/dL — ABNORMAL LOW (ref 8.9–10.3)
Creatinine, Ser: 0.81 mg/dL (ref 0.44–1.00)
GFR calc Af Amer: >60 mL/min (ref >60)
GFR calc non Af Amer: >60 mL/min (ref >60)
GLUCOSE: 113 mg/dL — AB (ref 65–99)
Potassium: 4.5 mmol/L (ref 3.5–5.1)
SODIUM: 140 mmol/L (ref 135–145)
Total Bilirubin: 0.8 mg/dL (ref 0.3–1.2)
Total Protein: 5.7 g/dL — ABNORMAL LOW (ref 6.5–8.1)

## 2017-03-22 LAB — CBC WITH DIFFERENTIAL/PLATELET
Basophils Absolute: 0 10*3/uL (ref 0–0.1)
Basophils Relative: 0 %
EOS PCT: 0 %
Eosinophils Absolute: 0.1 10*3/uL (ref 0–0.7)
HEMATOCRIT: 50.1 % — AB (ref 35.0–47.0)
Hemoglobin: 16.7 g/dL — ABNORMAL HIGH (ref 12.0–16.0)
LYMPHS ABS: 0.9 10*3/uL — AB (ref 1.0–3.6)
LYMPHS PCT: 6 %
MCH: 29.8 pg (ref 26.0–34.0)
MCHC: 33.3 g/dL (ref 32.0–36.0)
MCV: 89.5 fL (ref 80.0–100.0)
MONO ABS: 1 10*3/uL — AB (ref 0.2–0.9)
Monocytes Relative: 7 %
NEUTROS ABS: 13.3 10*3/uL — AB (ref 1.4–6.5)
Neutrophils Relative %: 87 %
Platelets: 144 10*3/uL — ABNORMAL LOW (ref 150–440)
RBC: 5.6 MIL/uL — ABNORMAL HIGH (ref 3.80–5.20)
RDW: 14.8 % — AB (ref 11.5–14.5)
WBC: 15.4 10*3/uL — ABNORMAL HIGH (ref 3.6–11.0)

## 2017-03-22 LAB — LACTIC ACID, PLASMA: LACTIC ACID, VENOUS: 0.8 mmol/L (ref 0.5–1.9)

## 2017-03-22 MED ORDER — TRAMADOL HCL 50 MG PO TABS: 50.0000 mg | ORAL_TABLET | Freq: Four times a day (QID) | ORAL | 0 refills | Status: AC | PRN

## 2017-03-22 MED ORDER — SULFAMETHOXAZOLE-TRIMETHOPRIM 800-160 MG PO TABS
1.0000 | ORAL_TABLET | Freq: Once | ORAL | Status: AC
  Administered 2017-03-22: 1 via ORAL
  Filled 2017-03-22: qty 1

## 2017-03-22 MED ORDER — HYDROCODONE-ACETAMINOPHEN 5-325 MG PO TABS
1.0000 | ORAL_TABLET | Freq: Once | ORAL | Status: AC
  Administered 2017-03-22: 1 via ORAL
  Filled 2017-03-22: qty 1

## 2017-03-22 MED ORDER — LIDOCAINE-EPINEPHRINE 1 %-1:100000 IJ SOLN
10.0000 mL | Freq: Once | INTRAMUSCULAR | Status: AC
  Administered 2017-03-22: 10 mL via INTRADERMAL
  Filled 2017-03-22: qty 10

## 2017-03-22 MED ORDER — SULFAMETHOXAZOLE-TRIMETHOPRIM 800-160 MG PO TABS: 1.0000 | ORAL_TABLET | Freq: Two times a day (BID) | ORAL | 0 refills | Status: DC

## 2017-03-22 NOTE — ED Provider Notes (Signed)
[facility] Emergency Department Provider Note   ____________________________________________    I have reviewed the triage vital signs and the nursing notes.   HISTORY  Chief Complaint Abscess     HPI Hannah Bowman is a 47 y.o. female who presents with complaints of an abscess.  Patient reports right axillary abscess which developed over the last 2 days and has become quite painful for her.  Especially with any movement of her arm.  She denies fevers or nausea or vomiting.  She has had an abscess before in the back of her neck.  She denies a history of diabetes.  No injury to the area.   Past Medical History:  Diagnosis Date  . Diverticulitis     Patient Active Problem List   Diagnosis Date Noted  . Chest pain 03/22/2016    Past Surgical History:  Procedure Laterality Date  . ABDOMINAL HYSTERECTOMY    . CESAREAN SECTION    . CHOLECYSTECTOMY      Prior to Admission medications   Medication Sig Start Date End Date Taking? Authorizing Provider  aspirin EC 81 MG tablet Take 81 mg by mouth daily.    [provider]  cholecalciferol (VITAMIN D) 400 units TABS tablet Take 400 Units by mouth daily.    [provider]  HYDROcodone-acetaminophen (NORCO/VICODIN) 5-325 MG tablet Take 1 tablet by mouth every 6 (six) hours as needed for moderate pain. 10/26/16   Tommi Rumps, PA-C  predniSONE (DELTASONE) 10 MG tablet Take 3 tablets once a day for 3 days 10/26/16   Tommi Rumps, PA-C  sulfamethoxazole-trimethoprim (BACTRIM DS,SEPTRA DS) 800-160 MG tablet Take 1 tablet by mouth 2 (two) times daily. 03/22/17   Jene Every, MD  traMADol (ULTRAM) 50 MG tablet Take 1 tablet (50 mg total) by mouth every 6 (six) hours as needed. 03/22/17 03/22/18  Jene Every, MD     Allergies Iodine  No family history on file.  Social History Social History   Tobacco Use  . Smoking status: Current Every Day Smoker    Packs/day: 1.00    Types: Cigarettes  . Smokeless tobacco: Never Used  Substance Use Topics  . Alcohol use: No  . Drug use: No    Review of Systems  Constitutional: No fever/chills Eyes: No visual changes.  ENT: No sore throat. Cardiovascular: Denies chest pain. Respiratory: No cough Gastrointestinal: No nausea, no vomiting.   Genitourinary: Negative for dysuria. Musculoskeletal: Axillary pain Skin:  mild surrounding erythema Neurological: Negative for headaches    ____________________________________________   PHYSICAL EXAM:  VITAL SIGNS: ED Triage Vitals  Enc Vitals Group     BP 03/22/17 1929 133/64     Pulse Rate 03/22/17 1929 82     Resp 03/22/17 1929 16     Temp 03/22/17 1929 100.3 F (37.9 C)     Temp Source 03/22/17 1929 Oral     SpO2 03/22/17 1929 99 %     Weight 03/22/17 1932 106.6 kg (235 lb)     Height 03/22/17 1932 1.626 m (5\' 4" )     Head Circumference --      Peak Flow --      Pain Score 03/22/17 1932 10     Pain Loc --      Pain Edu? --      Excl. in GC? --     Constitutional: Alert and oriented. No acute distress. Pleasant and interactive Eyes: Conjunctivae are normal.   Mouth/Throat: Mucous membranes  are moist.    Cardiovascular: Normal rate, regular rhythm.  Good peripheral circulation. Respiratory: Normal respiratory effort.  No retractions. Gastrointestinal: Soft and nontender. No distention.  No CVA tenderness. Genitourinary: deferred Musculoskeletal:   Warm and well perfused Neurologic:  Normal speech and language. No gross focal neurologic deficits are appreciated.  Skin:  Skin is warm, dry and intact.  Pointing and fluctuant area of erythema in the right axilla with induration superiorly and medially Psychiatric: Mood and affect are normal. Speech and behavior are normal.  ____________________________________________   LABS (all labs ordered are listed, but only abnormal results are displayed)  Labs Reviewed  COMPREHENSIVE METABOLIC PANEL -  Abnormal; Notable for the following components:      Result Value   Glucose, Bld 113 (*)    Calcium 8.6 (*)    Total Protein 5.7 (*)    All other components within normal limits  CBC WITH DIFFERENTIAL/PLATELET - Abnormal; Notable for the following components:   WBC 15.4 (*)    RBC 5.60 (*)    Hemoglobin 16.7 (*)    HCT 50.1 (*)    RDW 14.8 (*)    Platelets 144 (*)    Neutro Abs 13.3 (*)    Lymphs Abs 0.9 (*)    Monocytes Absolute 1.0 (*)    All other components within normal limits  LACTIC ACID, PLASMA   ____________________________________________  EKG  None ____________________________________________  RADIOLOGY  None ____________________________________________   PROCEDURES  Procedure(s) performed: No  ..Incision and Drainage Date/Time: 03/22/2017 10:26 PM Performed by: Jene Every, MD Authorized by: Jene Every, MD   Consent:    Consent obtained:  Verbal   Consent given by:  Patient   Risks discussed:  Bleeding, infection, incomplete drainage and pain   Alternatives discussed:  Alternative treatment, delayed treatment and observation Location:    Type:  Abscess   Location: axillary. Pre-procedure details:    Skin preparation:  Chloraprep Anesthesia (see MAR for exact dosages):    Anesthesia method:  Local infiltration   Local anesthetic:  Lidocaine 1% w/o epi Procedure type:    Complexity:  Complex Procedure details:    Incision types:  Single straight   Incision depth:  Subcutaneous   Scalpel blade:  11   Wound management:  Probed and deloculated   Drainage:  Purulent and bloody   Drainage amount:  Moderate   Wound treatment:  Wound left open   Packing materials:  None Post-procedure details:    Patient tolerance of procedure:  Tolerated well, no immediate complications     Critical Care performed: No ____________________________________________   INITIAL IMPRESSION / ASSESSMENT AND PLAN / ED COURSE  Pertinent labs & imaging  results that were available during my care of the patient were reviewed by me and considered in my medical decision making (see chart for details).  Patient presents with axillary abscess over the last 2 days.  Mild elevation in temperature and elevation in white blood cell count likely reflective of abscess however the patient does not demonstrate any evidence of sepsis, lactic is normal.  I&D performed by me.  P.o. Bactrim given in the emergency department, discussed with the patient do not feel there is need for IV antibiotics or admission at this time.  Patient does not want admission as she has kids at home, she reports she will return if any worsening of her symptoms.  Discussed 2-day recheck    ____________________________________________   FINAL CLINICAL IMPRESSION(S) / ED DIAGNOSES  Final diagnoses:  Abscess of axilla, right        Note:  This document was prepared using Dragon voice recognition software and may include unintentional dictation errors.    Jene EveryKinner, Kaliopi Blyden, MD 03/22/17 2229

## 2017-03-22 NOTE — ED Triage Notes (Signed)
Pt to ED via POV , [facility]. Pt states it "popped" this am but never drained. Pt has redness and swelling noted around abscess. Pt c/o nausea and chills. Pt appears pale. VS stable

## 2017-03-23 ENCOUNTER — Emergency Department: Payer: Medicaid Other

## 2017-03-23 ENCOUNTER — Other Ambulatory Visit: Payer: Self-pay

## 2017-03-23 ENCOUNTER — Emergency Department
Admission: EM | Admit: 2017-03-23 | Discharge: 2017-03-23 | Disposition: A | Discharge status: Home / Self Care | Payer: Medicaid Other | Attending: Emergency Medicine | Admitting: Emergency Medicine

## 2017-03-23 DIAGNOSIS — Z79899 Other long term (current) drug therapy: Secondary | ICD-10-CM | POA: Diagnosis not present

## 2017-03-23 DIAGNOSIS — R531 Weakness: Secondary | ICD-10-CM | POA: Insufficient documentation

## 2017-03-23 DIAGNOSIS — Z7982 Long term (current) use of aspirin: Secondary | ICD-10-CM | POA: Diagnosis not present

## 2017-03-23 DIAGNOSIS — L0291 Cutaneous abscess, unspecified: Secondary | ICD-10-CM

## 2017-03-23 DIAGNOSIS — F17210 Nicotine dependence, cigarettes, uncomplicated: Secondary | ICD-10-CM | POA: Diagnosis not present

## 2017-03-23 DIAGNOSIS — L02411 Cutaneous abscess of right axilla: Secondary | ICD-10-CM | POA: Diagnosis not present

## 2017-03-23 LAB — CBC WITH DIFFERENTIAL/PLATELET
Basophils Absolute: 0 10*3/uL (ref 0–0.1)
Basophils Relative: 0 %
EOS PCT: 1 %
Eosinophils Absolute: 0.2 10*3/uL (ref 0–0.7)
HEMATOCRIT: 44.8 % (ref 35.0–47.0)
HEMOGLOBIN: 14.7 g/dL (ref 12.0–16.0)
LYMPHS ABS: 0.9 10*3/uL — AB (ref 1.0–3.6)
LYMPHS PCT: 7 %
MCH: 29.7 pg (ref 26.0–34.0)
MCHC: 32.9 g/dL (ref 32.0–36.0)
MCV: 90.2 fL (ref 80.0–100.0)
Monocytes Absolute: 1.1 10*3/uL — ABNORMAL HIGH (ref 0.2–0.9)
Monocytes Relative: 8 %
NEUTROS ABS: 10.9 10*3/uL — AB (ref 1.4–6.5)
NEUTROS PCT: 84 %
Platelets: 143 10*3/uL — ABNORMAL LOW (ref 150–440)
RBC: 4.96 MIL/uL (ref 3.80–5.20)
RDW: 15 % — ABNORMAL HIGH (ref 11.5–14.5)
WBC: 13.2 10*3/uL — AB (ref 3.6–11.0)

## 2017-03-23 LAB — URINALYSIS, COMPLETE (UACMP) WITH MICROSCOPIC
BILIRUBIN URINE: NEGATIVE
GLUCOSE, UA: NEGATIVE mg/dL
HGB URINE DIPSTICK: NEGATIVE
Ketones, ur: NEGATIVE mg/dL
LEUKOCYTES UA: NEGATIVE
NITRITE: NEGATIVE
PROTEIN: NEGATIVE mg/dL
Specific Gravity, Urine: 1.017 (ref 1.005–1.030)
pH: 5 (ref 5.0–8.0)

## 2017-03-23 LAB — BASIC METABOLIC PANEL
ANION GAP: 9 (ref 5–15)
BUN: 11 mg/dL (ref 6–20)
CHLORIDE: 106 mmol/L (ref 101–111)
CO2: 23 mmol/L (ref 22–32)
Calcium: 8.4 mg/dL — ABNORMAL LOW (ref 8.9–10.3)
Creatinine, Ser: 0.87 mg/dL (ref 0.44–1.00)
GFR calc Af Amer: >60 mL/min (ref >60)
GFR calc non Af Amer: >60 mL/min (ref >60)
GLUCOSE: 112 mg/dL — AB (ref 65–99)
Potassium: 3.5 mmol/L (ref 3.5–5.1)
Sodium: 138 mmol/L (ref 135–145)

## 2017-03-23 LAB — INFLUENZA PANEL BY PCR (TYPE A & B)
Influenza A By PCR: NEGATIVE
Influenza B By PCR: NEGATIVE

## 2017-03-23 LAB — LACTIC ACID, PLASMA: Lactic Acid, Venous: 1.6 mmol/L (ref 0.5–1.9)

## 2017-03-23 MED ORDER — VANCOMYCIN HCL IN DEXTROSE 1-5 GM/200ML-% IV SOLN
1000.0000 mg | Freq: Once | INTRAVENOUS | Status: AC
  Administered 2017-03-23: 1000 mg via INTRAVENOUS
  Filled 2017-03-23: qty 200

## 2017-03-23 MED ORDER — SODIUM CHLORIDE 0.9 % IV BOLUS (SEPSIS)
1000.0000 mL | Freq: Once | INTRAVENOUS | Status: AC
  Administered 2017-03-23: 1000 mL via INTRAVENOUS

## 2017-03-23 NOTE — Discharge Instructions (Signed)
Please seek medical attention for any high fevers, chest pain, shortness of breath, change in behavior, persistent vomiting, bloody stool or any other new or concerning symptoms.  

## 2017-03-23 NOTE — ED Triage Notes (Signed)
Pt arrived via EMS from home with c/o dizziness that began earlier today. Pt reports being seen [facility] yesterday and having a cyst located under her right armpit lanced. Pt reports she was given antibiotics before she left and sent home with a prescription that she hs not been able to fill. Pt reports n/v with 2 episodes of emesis today. Pt denies any diarrhea. Pt is A&O x4 at this time.

## 2017-03-23 NOTE — ED Provider Notes (Signed)
[facility] Emergency Department Provider Note   ____________________________________________   I have reviewed the triage vital signs and the nursing notes.   HISTORY  Chief Complaint Weakness  History limited by: Not Limited   HPI Hannah NevinsHeather B Bowman is a 47 y.o. female who presents to the emergency department today because of concern for worsening condition and weakness. The patient was seen in the emergency department yesterday for a right axilla abscess. This was I and ded and the patient was discharged with a prescription for antibiotics. The patient did not feel that prescription. She states that she is also having increasing pain to the right upper chest. She states she did not take anything for pain today. She also felt like she was having fevers and chills today.    Per medical record review patient has a history of ED visit yesterday with I and D.   Past Medical History:  Diagnosis Date  . Diverticulitis     Patient Active Problem List   Diagnosis Date Noted  . Chest pain 03/22/2016    Past Surgical History:  Procedure Laterality Date  . ABDOMINAL HYSTERECTOMY    . CESAREAN SECTION    . CHOLECYSTECTOMY      Prior to Admission medications   Medication Sig Start Date End Date Taking? Authorizing Provider  aspirin EC 81 MG tablet Take 81 mg by mouth daily.    [provider]  cholecalciferol (VITAMIN D) 400 units TABS tablet Take 400 Units by mouth daily.    [provider]  HYDROcodone-acetaminophen (NORCO/VICODIN) 5-325 MG tablet Take 1 tablet by mouth every 6 (six) hours as needed for moderate pain. 10/26/16   Tommi RumpsSummers, Rhonda L, PA-C  predniSONE (DELTASONE) 10 MG tablet Take 3 tablets once a day for 3 days 10/26/16   Tommi RumpsSummers, Rhonda L, PA-C  sulfamethoxazole-trimethoprim (BACTRIM DS,SEPTRA DS) 800-160 MG tablet Take 1 tablet by mouth 2 (two) times daily. 03/22/17   Jene EveryKinner, Robert, MD  traMADol (ULTRAM) 50 MG tablet Take 1  tablet (50 mg total) by mouth every 6 (six) hours as needed. 03/22/17 03/22/18  Jene EveryKinner, Robert, MD    Allergies Iodine  No family history on file.  Social History Social History   Tobacco Use  . Smoking status: Current Every Day Smoker    Packs/day: 1.00    Types: Cigarettes  . Smokeless tobacco: Never Used  Substance Use Topics  . Alcohol use: No  . Drug use: No    Review of Systems Constitutional: Positive for fevers and chills. Eyes: No visual changes. ENT: No sore throat. Cardiovascular: Positive for chest pain. Respiratory: Denies shortness of breath. Gastrointestinal: No abdominal pain.  No nausea, no vomiting.  No diarrhea.   Genitourinary: Negative for dysuria. Musculoskeletal: Negative for back pain. Skin: Positive for right axilla abscess. Neurological: Negative for headaches, focal weakness or numbness.  ____________________________________________   PHYSICAL EXAM:  VITAL SIGNS: ED Triage Vitals  Enc Vitals Group     BP 03/23/17 1631 132/65     Pulse Rate 03/23/17 1631 95     Resp 03/23/17 1631 (!) 22     Temp 03/23/17 1631 99.6 F (37.6 C)     Temp Source 03/23/17 1631 Oral     SpO2 03/23/17 1631 95 %     Weight 03/23/17 1632 233 lb (105.7 kg)     Height 03/23/17 1632 5\' 4"  (1.626 m)     Head Circumference --      Peak Flow --  Pain Score 03/23/17 1632 9   Constitutional: Alert and oriented. Well appearing and in no distress. Eyes: Conjunctivae are normal.  ENT   Head: Normocephalic and atraumatic.   Nose: No congestion/rhinnorhea.   Mouth/Throat: Mucous membranes are moist.   Neck: No stridor. Hematological/Lymphatic/Immunilogical: No cervical lymphadenopathy. Cardiovascular: Normal rate, regular rhythm.  No murmurs, rubs, or gallops.  Respiratory: Normal respiratory effort without tachypnea nor retractions. Breath sounds are clear and equal bilaterally. No wheezes/rales/rhonchi. Gastrointestinal: Soft and non tender. No  rebound. No guarding.  Genitourinary: Deferred Musculoskeletal: Normal range of motion in all extremities. No lower extremity edema. Neurologic:  Normal speech and language. No gross focal neurologic deficits are appreciated.  Skin:  Skin is warm, dry and intact. No rash noted. Psychiatric: Mood and affect are normal. Speech and behavior are normal. Patient exhibits appropriate insight and judgment.  ____________________________________________    LABS (pertinent positives/negatives)  BMP wnl except glu 112, ca 8.4 CBC wbc 13.2, hgb 14.7, plt 143 UA not consistent with infection Lactic acid 1.6 Influenza negative ____________________________________________   EKG  None  ____________________________________________    RADIOLOGY  CXR No acute disease  ____________________________________________   PROCEDURES  Procedures  ____________________________________________   INITIAL IMPRESSION / ASSESSMENT AND PLAN / ED COURSE  Pertinent labs & imaging results that were available during my care of the patient were reviewed by me and considered in my medical decision making (see chart for details).  Patient presented to the emergency department today because of concerns for weakness worsening condition.  She did undergo an I&D yesterday.  On exam the patient's I&D site actually looks quite well.  No fluctuance.  Some very minimal erythema.  Patient's white blood cell count is improved over yesterday.  I do think she is likely somewhat dehydrated.  She was given IV fluids and did feel somewhat better.  Given the patient has not yet filled her prescription for antibiotics will give her a dose of IV antibiotics here in the emergency department.  Did discuss with patient importance of getting her antibiotics filled and to continue to take them.   ____________________________________________   FINAL CLINICAL IMPRESSION(S) / ED DIAGNOSES  Final diagnoses:  Weakness  Abscess      Note: This dictation was prepared with Dragon dictation. Any transcriptional errors that result from this process are unintentional     Phineas Semen, MD 03/24/17 [phone]

## 2017-03-23 NOTE — ED Notes (Signed)
Pt no longer on Droplet precautions after flu resulted negative. RN rounding on pt. PT upset and verbalized frustration with RN. RN attempted to explain situation to pt and gave new blankets to pt as well as turned the tv on and dimmed the lights. Pt in NAD at this time.

## 2017-03-25 DIAGNOSIS — R918 Other nonspecific abnormal finding of lung field: Secondary | ICD-10-CM | POA: Insufficient documentation

## 2017-03-25 DIAGNOSIS — M60011 Infective myositis, right shoulder: Secondary | ICD-10-CM | POA: Insufficient documentation

## 2017-03-25 DIAGNOSIS — L02411 Cutaneous abscess of right axilla: Secondary | ICD-10-CM | POA: Insufficient documentation

## 2017-03-25 DIAGNOSIS — Z720 Tobacco use: Secondary | ICD-10-CM | POA: Insufficient documentation

## 2017-03-25 DIAGNOSIS — K5792 Diverticulitis of intestine, part unspecified, without perforation or abscess without bleeding: Secondary | ICD-10-CM | POA: Insufficient documentation

## 2017-03-28 LAB — CULTURE, BLOOD (ROUTINE X 2)
CULTURE: NO GROWTH
CULTURE: NO GROWTH

## 2017-04-01 DIAGNOSIS — S41101A Unspecified open wound of right upper arm, initial encounter: Secondary | ICD-10-CM | POA: Insufficient documentation

## 2017-07-15 ENCOUNTER — Other Ambulatory Visit: Payer: Self-pay | Admitting: Obstetrics and Gynecology

## 2018-01-09 IMAGING — CT CT ANGIO CHEST
3 of 7 series · 16 of 36 positions shown · IV contrast (isovue)
Comparison: Chest radiograph March 06, 2016

CLINICAL DATA: Left-sided chest pain with nausea

EXAM:
CT ANGIOGRAPHY CHEST WITH CONTRAST
TECHNIQUE: Initially, axial CT images were obtained through the chest without
intravenous contrast material administration. Multidetector CT
imaging of the chest was performed using the standard protocol
during bolus administration of intravenous contrast. Multiplanar CT
image reconstructions and MIPs were obtained to evaluate the
vascular anatomy.
CONTRAST:  100 mL Isovue 370 nonionic

[Series 3: cta chest · axial · 0.73mm/px · z∈[-441,-239]mm · 7 of 135 slices shown]
[im 17/135  lung]
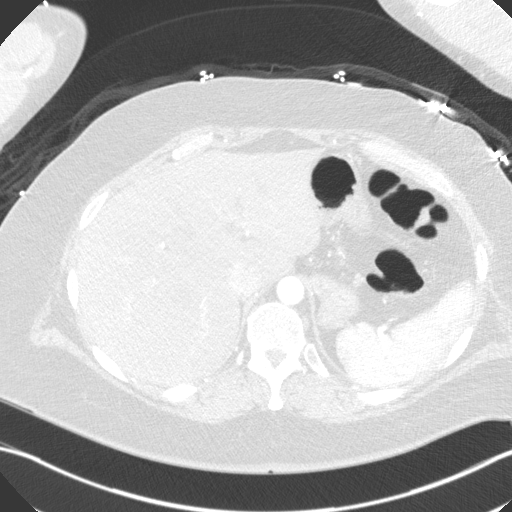
[im 34/135  mediastinal]
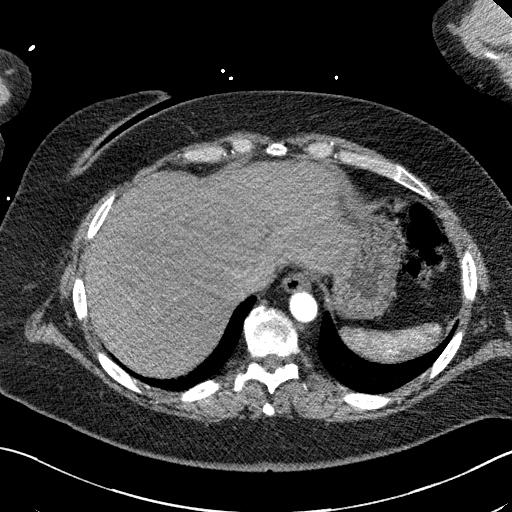
[im 51/135  lung]
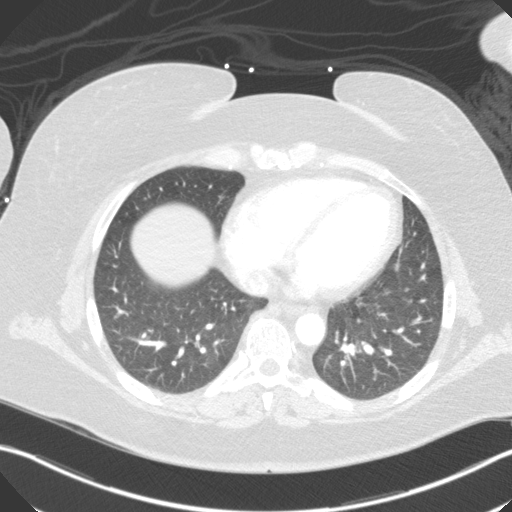
[im 68/135  mediastinal]
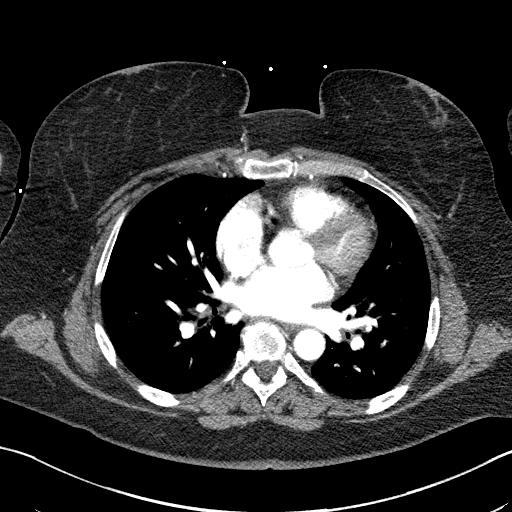
[im 84/135  lung]
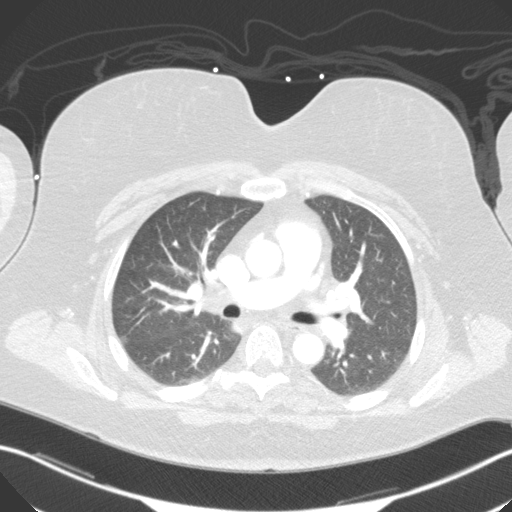
[im 101/135  mediastinal]
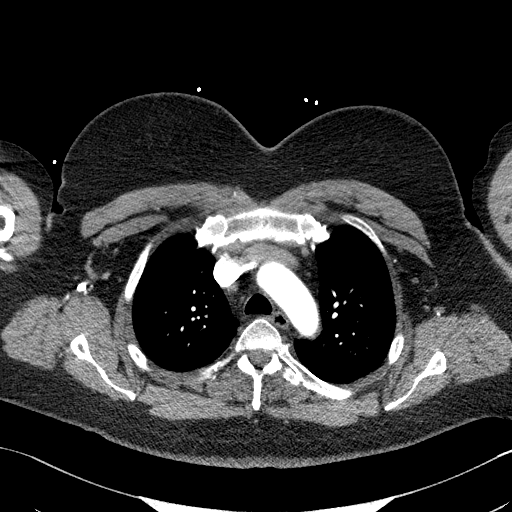
[im 118/135  lung]
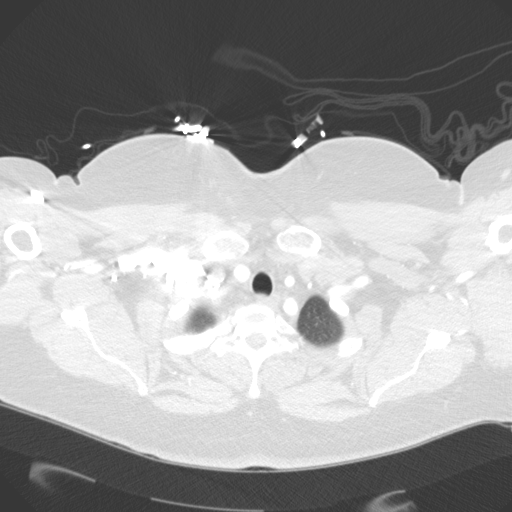

[Series 5: ax thin · axial · 0.62mm/px · z∈[-445,-229]mm · 8 of 250 slices shown]
[im 17/250  lung]
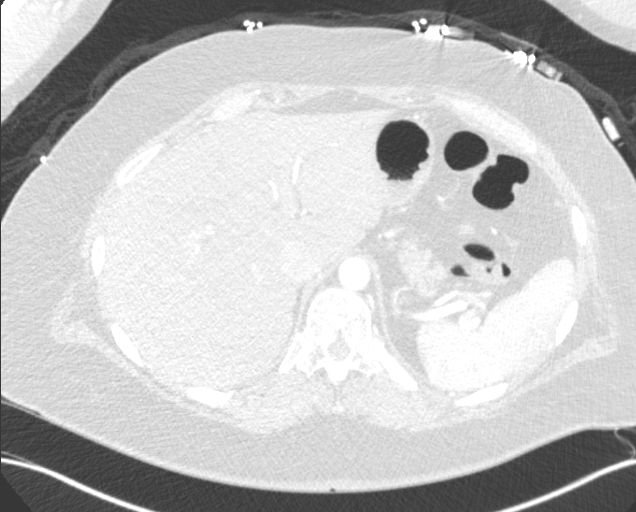
[im 50/250  lung]
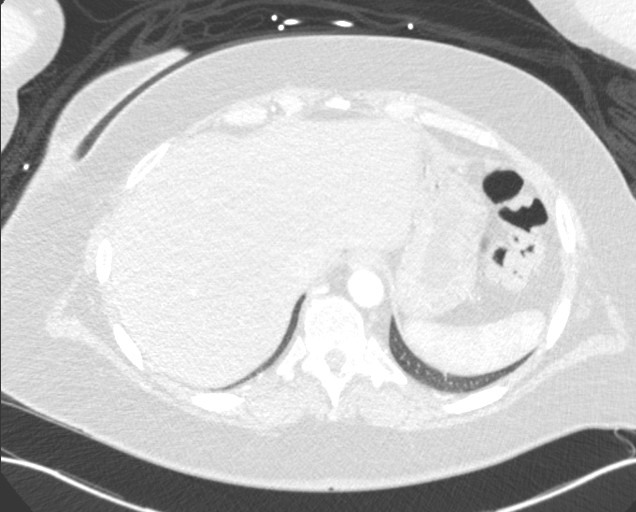
[im 84/250  lung]
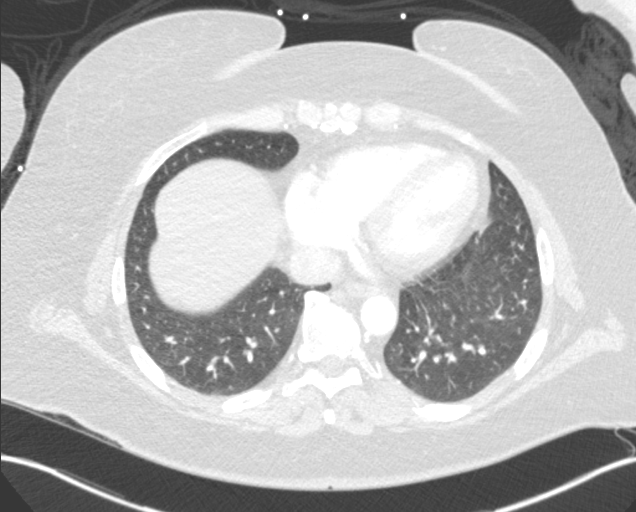
[im 117/250  lung]
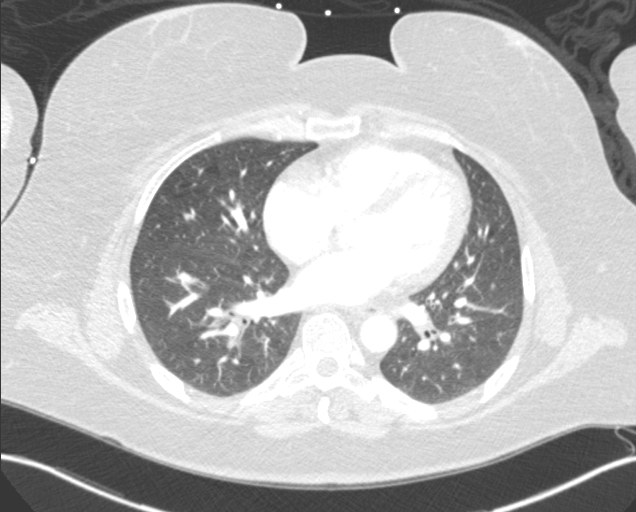
[im 133/250  lung]
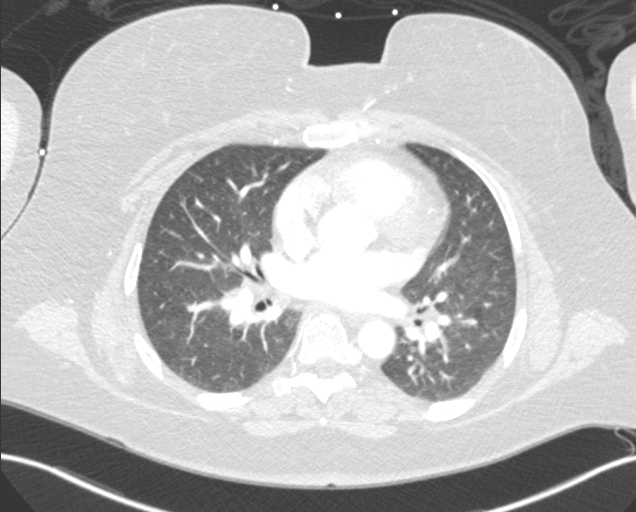
[im 167/250  lung]
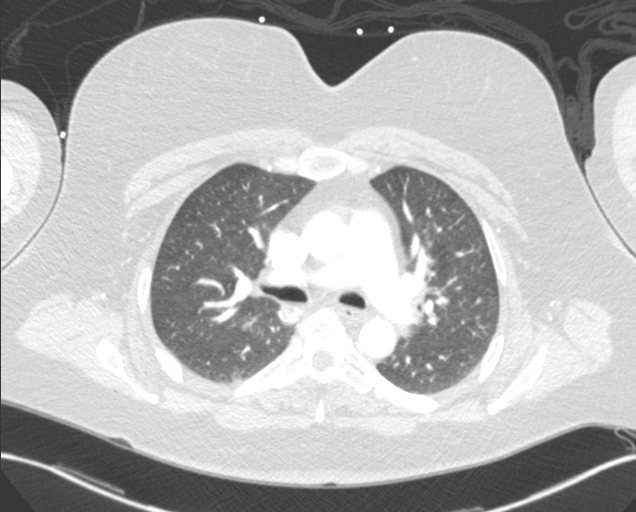
[im 200/250  lung]
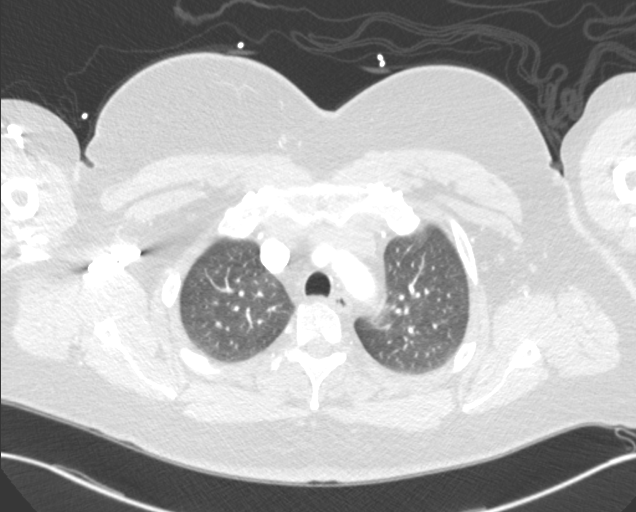
[im 233/250  lung]
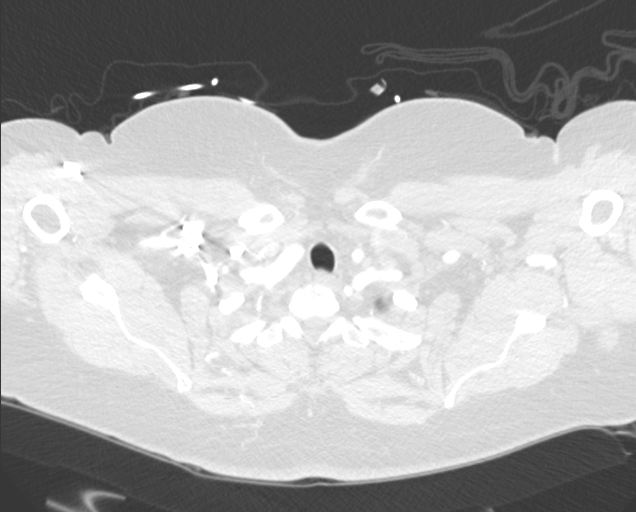

[Series 13: cor mpr · coronal · 0.49mm/px · 1 of 160 slices shown]
[im 80/160  mediastinal]
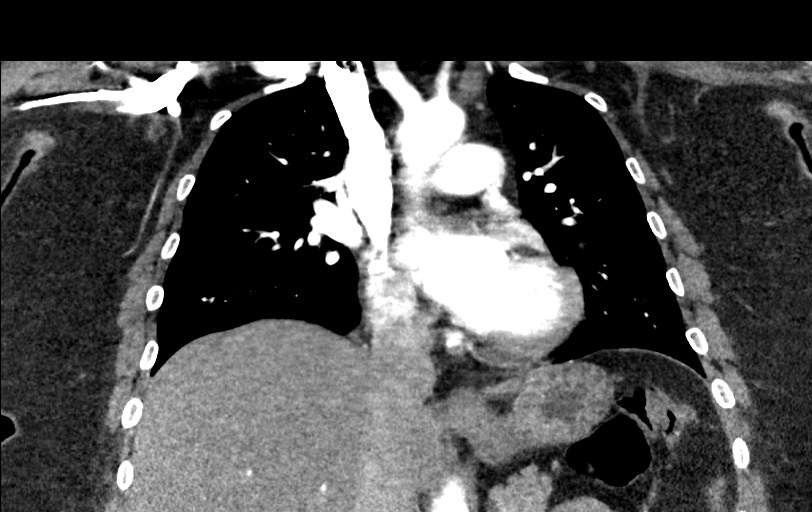

[16 of 36 positions shown; findings below may reference images not displayed]

FINDINGS: Cardiovascular: There is no evidence of intramural hematoma in the
aorta on the noncontrast enhanced study. There is no thoracic aortic
aneurysm or dissection. Visualized great vessels appear
unremarkable. Pericardium is not appreciably thickened. There is no
evident pulmonary embolus.

Mediastinum/Nodes: Visualized thyroid appears unremarkable. There is
no appreciable thoracic adenopathy.

Lungs/Pleura: There is mild atelectasis in the posterior right base.
No edema or consolidation. No appreciable pleural effusion or
pleural thickening.

Upper Abdomen: Visualized upper abdominal structures appear within
normal limits.

Musculoskeletal: There is degenerative change in the thoracic spine.
There are no blastic or lytic bone lesions.

Review of the MIP images confirms the above findings.
IMPRESSION: No thoracic aortic aneurysm or dissection. No pulmonary embolus.
There is mild right base atelectasis. No edema or consolidation. No
evident adenopathy.

## 2018-01-09 IMAGING — CT CT ANGIO NECK
3 of 7 series · 6 of 16 positions shown · IV contrast (APPLIED)
Comparison: None.

CLINICAL DATA: Chest pain traveling into the neck as well as into
the right arm and right jaw. Nausea with 1 episode of vomiting.

EXAM:
CT ANGIOGRAPHY NECK
TECHNIQUE: Multidetector CT imaging of the neck was performed using the
standard protocol during bolus administration of intravenous
contrast. Multiplanar CT image reconstructions and MIPs were
obtained to evaluate the vascular anatomy. Carotid stenosis
measurements (when applicable) are obtained utilizing NASCET
criteria, using the distal internal carotid diameter as the
denominator.
CONTRAST:  100 mL Isovue 370

[Series 7: ax thin · axial · 0.48mm/px · z∈[-236,-160]mm · 2 of 232 slices shown]
[im 78/232  soft-tissue]
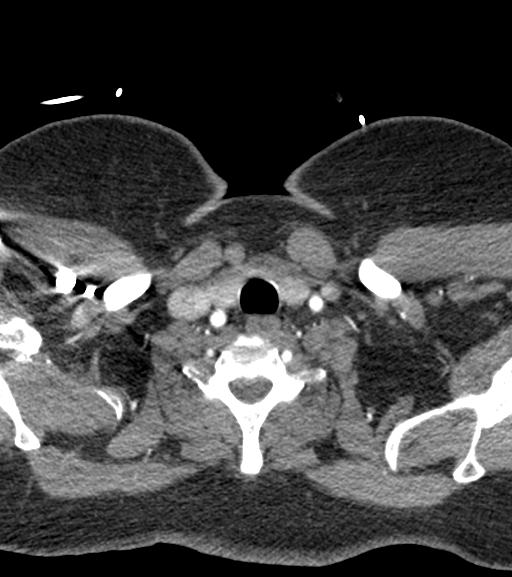
[im 155/232  bone]
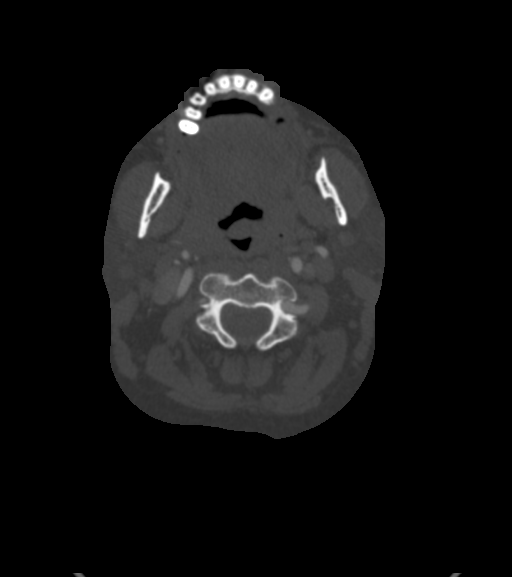

[Series 8: coronal thin · coronal · 0.45mm/px · 2 of 276 slices shown]
[im 92/276  soft-tissue]
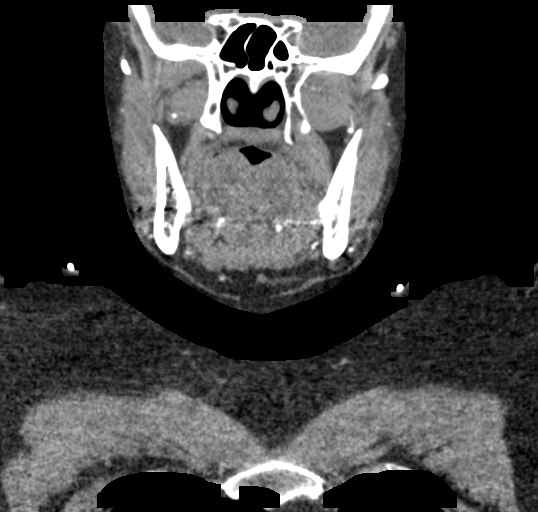
[im 184/276  soft-tissue]
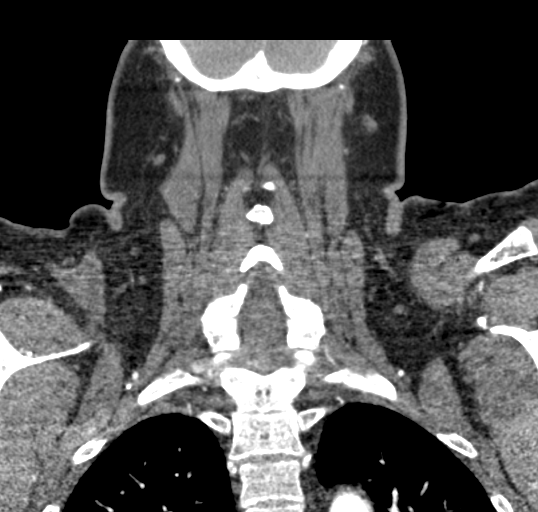

[Series 9: sagittal thin · sagittal · 0.46mm/px · 2 of 245 slices shown]
[im 82/245  soft-tissue]
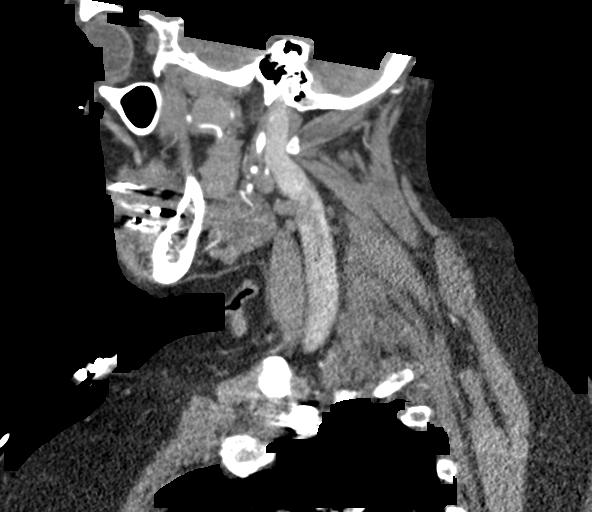
[im 163/245  soft-tissue]
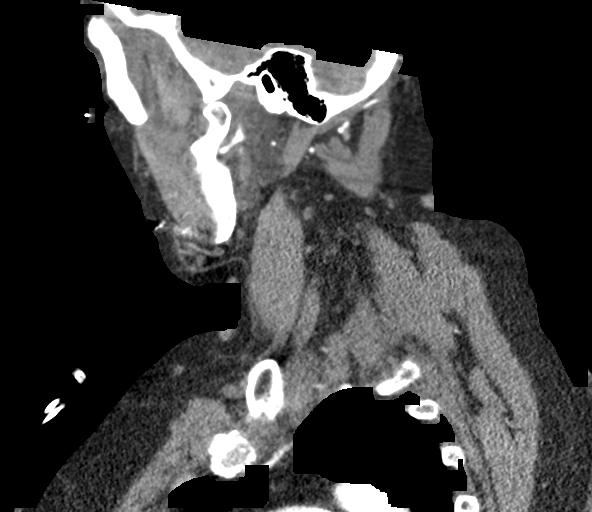

[6 of 16 positions shown; findings below may reference images not displayed]

FINDINGS: Aortic arch: 3 vessel aortic arch. Widely patent brachiocephalic and
subclavian arteries.

Right carotid system: Patent without evidence of stenosis or
dissection.

Left carotid system: Patent without evidence of stenosis or
dissection.

Vertebral arteries: Patent without evidence of stenosis or
dissection. Dominant left vertebral artery.

Skeleton: No acute osseous abnormality or suspicious osseous lesion.

Other neck: No mass or lymph node enlargement.

Upper chest: Evaluated on separate chest CTA.
IMPRESSION: Negative neck CTA.

## 2018-08-13 ENCOUNTER — Other Ambulatory Visit: Payer: Self-pay | Admitting: Physician Assistant

## 2018-08-13 DIAGNOSIS — N6315 Unspecified lump in the right breast, overlapping quadrants: Secondary | ICD-10-CM

## 2018-08-27 ENCOUNTER — Other Ambulatory Visit: Payer: Self-pay

## 2018-08-27 ENCOUNTER — Ambulatory Visit: Payer: Medicaid Other | Attending: Oncology

## 2018-08-27 ENCOUNTER — Ambulatory Visit
Admission: RE | Admit: 2018-08-27 | Discharge: 2018-08-27 | Disposition: A | Discharge status: Home / Self Care | Payer: Medicaid Other | Source: Ambulatory Visit | Attending: Oncology | Admitting: Oncology

## 2018-08-27 VITALS — BP 124/81 | HR 78 | Temp 99.1°F | Ht 65.5 in | Wt 219.0 lb

## 2018-08-27 DIAGNOSIS — N63 Unspecified lump in unspecified breast: Secondary | ICD-10-CM

## 2018-08-27 NOTE — Progress Notes (Addendum)
  Subjective:     Patient ID: Huntley Dec, female   DOB: [birth year], 48 y.o.   MRN: [id number]  HPI   Review of Systems     Objective:   Physical Exam Chest:     Breasts: Breasts are asymmetrical.       Comments: Right breast larger than left; redness under breast; bilateral lower breast sores.       Assessment:     48 year old referred by Philis Pique for right breast mass.  Patient reports noticed mass about 6 months ago, but it has gotten bigger.  Patient screened, and meets BCCCP eligibility.  Patient does not have insurance, Medicare or Medicaid. Instructed patient on breast self awareness using teach back method.  Palpated a 1cm x.5 cm mobile mass at 6 o'clock right breast.  Patient reports she has noticed spontaneous discharge in her bra.  States sometimes yellow. At times with blood mixed in. Risk Assessment    Risk Scores      08/27/2018   Last edited by: Rico Junker, RN   5-year risk: 1.8 %   Lifetime risk: 16 %            Plan:     Sent for bilateral diagnostic mammogram, and ultrasound.

## 2018-08-28 ENCOUNTER — Other Ambulatory Visit: Payer: Self-pay

## 2018-08-28 DIAGNOSIS — N63 Unspecified lump in unspecified breast: Secondary | ICD-10-CM

## 2018-08-28 NOTE — Progress Notes (Signed)
Received [facility] 4 mammogram results for subareolar right breast mass.  Orders -placed for aspiration, and possible biopsy per radiology recommendation.

## 2018-09-04 ENCOUNTER — Other Ambulatory Visit: Payer: Self-pay

## 2018-09-04 ENCOUNTER — Ambulatory Visit
Admission: RE | Admit: 2018-09-04 | Discharge: 2018-09-04 | Disposition: A | Discharge status: Home / Self Care | Payer: Medicaid Other | Source: Ambulatory Visit | Attending: Oncology | Admitting: Oncology

## 2018-09-04 DIAGNOSIS — N63 Unspecified lump in unspecified breast: Secondary | ICD-10-CM

## 2018-09-04 DIAGNOSIS — N6315 Unspecified lump in the right breast, overlapping quadrants: Secondary | ICD-10-CM | POA: Insufficient documentation

## 2018-09-04 HISTORY — PX: BREAST BIOPSY: SHX20

## 2018-09-15 LAB — SURGICAL PATHOLOGY

## 2018-11-23 NOTE — Progress Notes (Signed)
Benign biopsy results reported to patient,  To return in one year for annual screening. Copy to [facility].

## 2018-12-19 ENCOUNTER — Emergency Department: Payer: Medicaid Other

## 2018-12-19 ENCOUNTER — Encounter: Payer: Self-pay | Admitting: Intensive Care

## 2018-12-19 ENCOUNTER — Emergency Department
Admission: EM | Admit: 2018-12-19 | Discharge: 2018-12-19 | Disposition: A | Discharge status: Home / Self Care | Payer: Medicaid Other | Attending: Emergency Medicine | Admitting: Emergency Medicine

## 2018-12-19 ENCOUNTER — Other Ambulatory Visit: Payer: Self-pay

## 2018-12-19 DIAGNOSIS — Z5321 Procedure and treatment not carried out due to patient leaving prior to being seen by health care provider: Secondary | ICD-10-CM | POA: Diagnosis not present

## 2018-12-19 DIAGNOSIS — N611 Abscess of the breast and nipple: Secondary | ICD-10-CM | POA: Diagnosis present

## 2018-12-19 LAB — COMPREHENSIVE METABOLIC PANEL
ALT: 20 U/L (ref 0–44)
AST: 16 U/L (ref 15–41)
Albumin: 3.5 g/dL (ref 3.5–5.0)
Alkaline Phosphatase: 45 U/L (ref 38–126)
Anion gap: 8 (ref 5–15)
BUN: 15 mg/dL (ref 6–20)
CO2: 26 mmol/L (ref 22–32)
Calcium: 8.6 mg/dL — ABNORMAL LOW (ref 8.9–10.3)
Chloride: 106 mmol/L (ref 98–111)
Creatinine, Ser: 0.78 mg/dL (ref 0.44–1.00)
GFR calc Af Amer: >60 mL/min (ref >60)
GFR calc non Af Amer: >60 mL/min (ref >60)
Glucose, Bld: 92 mg/dL (ref 70–99)
Potassium: 4.6 mmol/L (ref 3.5–5.1)
Sodium: 140 mmol/L (ref 135–145)
Total Bilirubin: 0.7 mg/dL (ref 0.3–1.2)
Total Protein: 6.2 g/dL — ABNORMAL LOW (ref 6.5–8.1)

## 2018-12-19 LAB — CBC WITH DIFFERENTIAL/PLATELET
Abs Immature Granulocytes: 0.01 10*3/uL (ref 0.00–0.07)
Basophils Absolute: 0.1 10*3/uL (ref 0.0–0.1)
Basophils Relative: 1 %
Eosinophils Absolute: 0.1 10*3/uL (ref 0.0–0.5)
Eosinophils Relative: 2 %
HCT: 47.1 % — ABNORMAL HIGH (ref 36.0–46.0)
Hemoglobin: 15.9 g/dL — ABNORMAL HIGH (ref 12.0–15.0)
Immature Granulocytes: 0 %
Lymphocytes Relative: 22 %
Lymphs Abs: 1.8 10*3/uL (ref 0.7–4.0)
MCH: 30.3 pg (ref 26.0–34.0)
MCHC: 33.8 g/dL (ref 30.0–36.0)
MCV: 89.7 fL (ref 80.0–100.0)
Monocytes Absolute: 0.7 10*3/uL (ref 0.1–1.0)
Monocytes Relative: 8 %
Neutro Abs: 5.5 10*3/uL (ref 1.7–7.7)
Neutrophils Relative %: 67 %
Platelets: 215 10*3/uL (ref 150–400)
RBC: 5.25 MIL/uL — ABNORMAL HIGH (ref 3.87–5.11)
RDW: 13.1 % (ref 11.5–15.5)
WBC: 8.3 10*3/uL (ref 4.0–10.5)
nRBC: 0 % (ref 0.0–0.2)

## 2018-12-19 LAB — LACTIC ACID, PLASMA: Lactic Acid, Venous: 0.8 mmol/L (ref 0.5–1.9)

## 2018-12-19 MED ORDER — IBUPROFEN 400 MG PO TABS
400.0000 mg | ORAL_TABLET | Freq: Once | ORAL | Status: AC | PRN
  Administered 2018-12-19: 12:00:00 400 mg via ORAL
  Filled 2018-12-19: qty 1

## 2018-12-19 NOTE — ED Triage Notes (Signed)
Patient has redness/abscess all over right breast. Patient had breast scan around a month ago through [facility] and was told to just keep an eye on it and that it was not cancer.

## 2018-12-19 NOTE — ED Notes (Signed)
Pt called from lobby to room with no answer at this time

## 2019-01-26 IMAGING — CR DG CHEST 2V
2 series · 2 of 2 positions shown · non-contrast
Comparison: 03/22/2016.

CLINICAL DATA: Pt arrived via EMS from home with c/o dizziness that
began earlier today. Pt reports being seen here at [REDACTED] yesterday
and having a cyst located under her right armpit lanced. Pt reports
she was given antibiotics before she left and sent home with a
prescription that she hs not been able to fill. Pt reports n/v with
2 episodes of emesis today. Pt also states pain at site of cyst and
with movement of RT arm.

EXAM:
CHEST  2 VIEW

[chest pa]
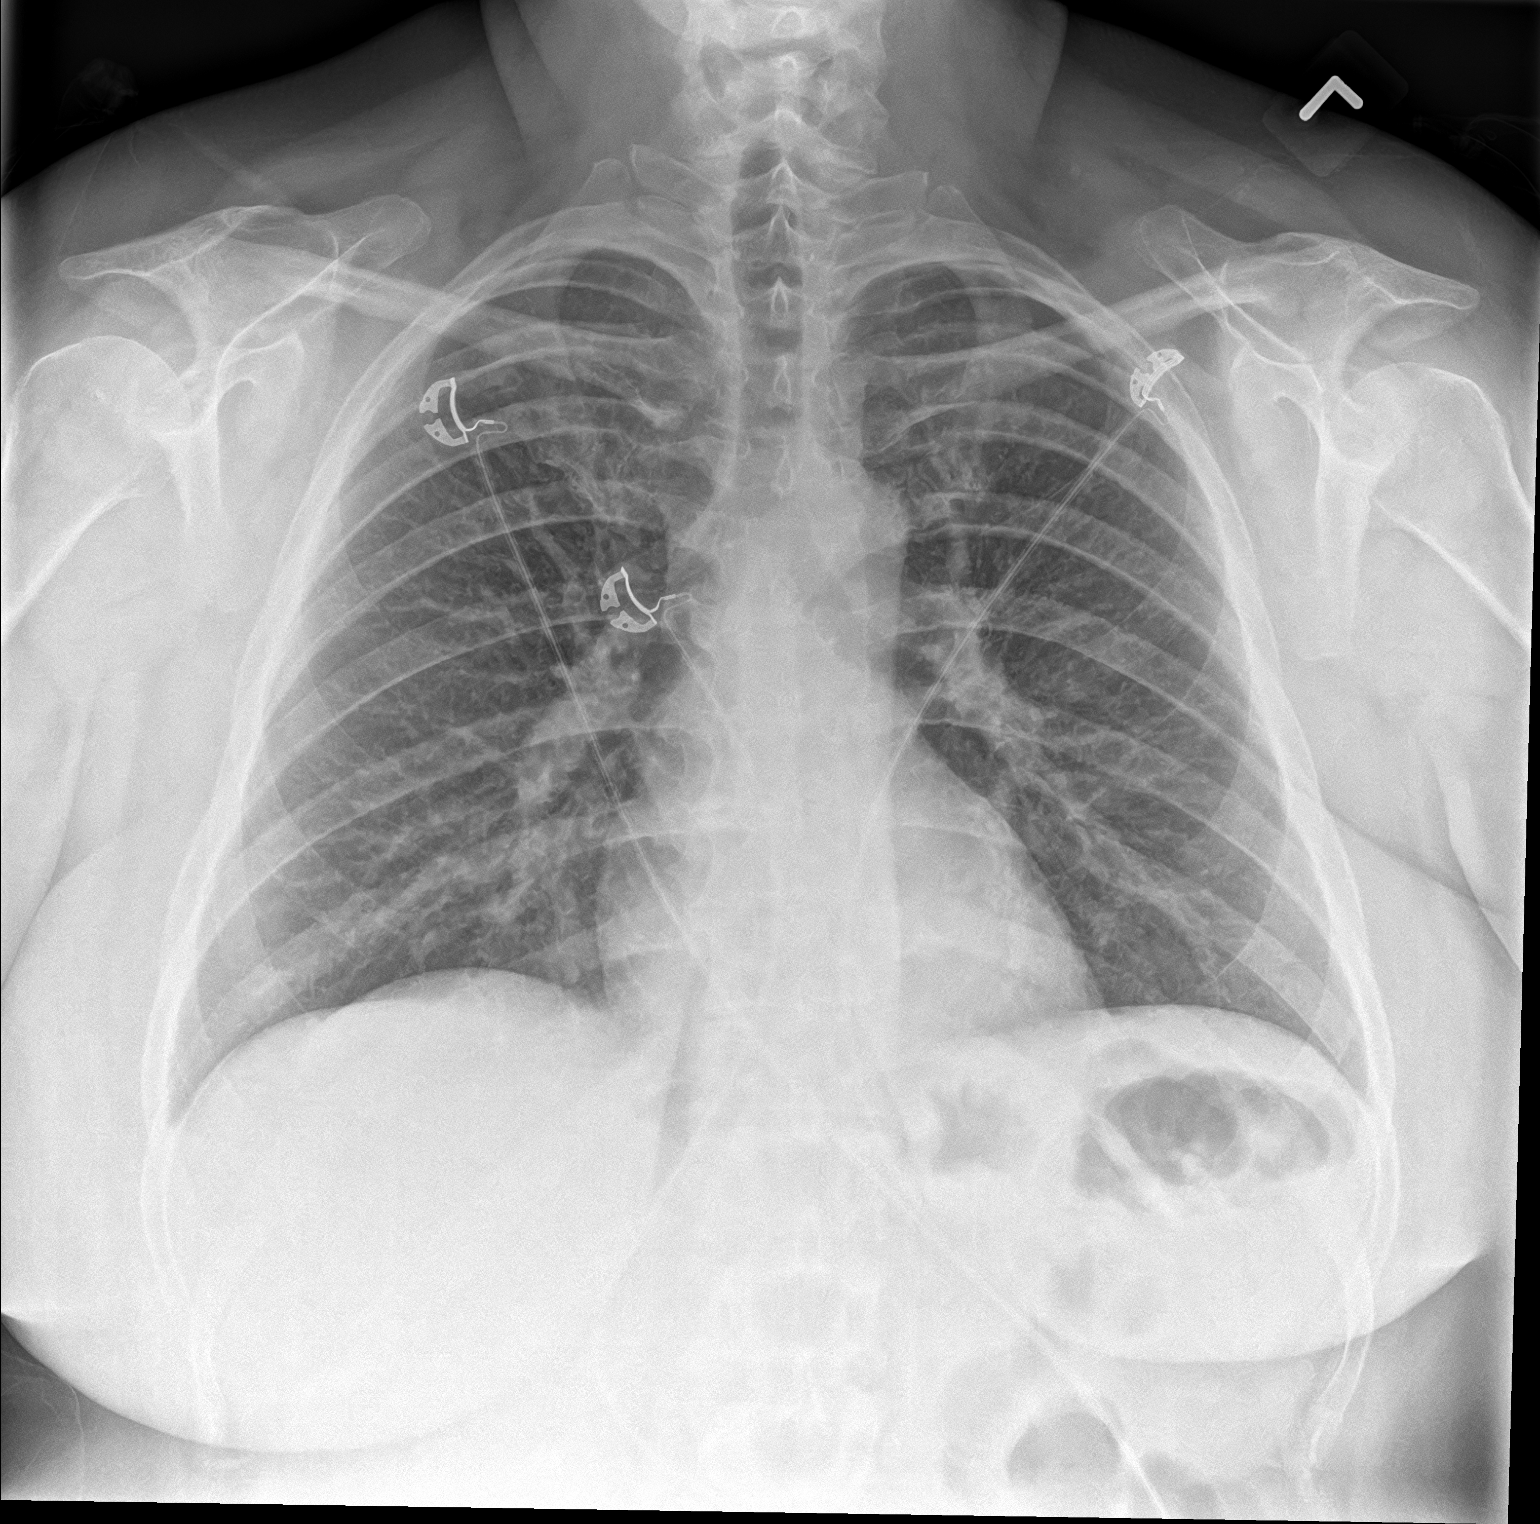

[chest lat]
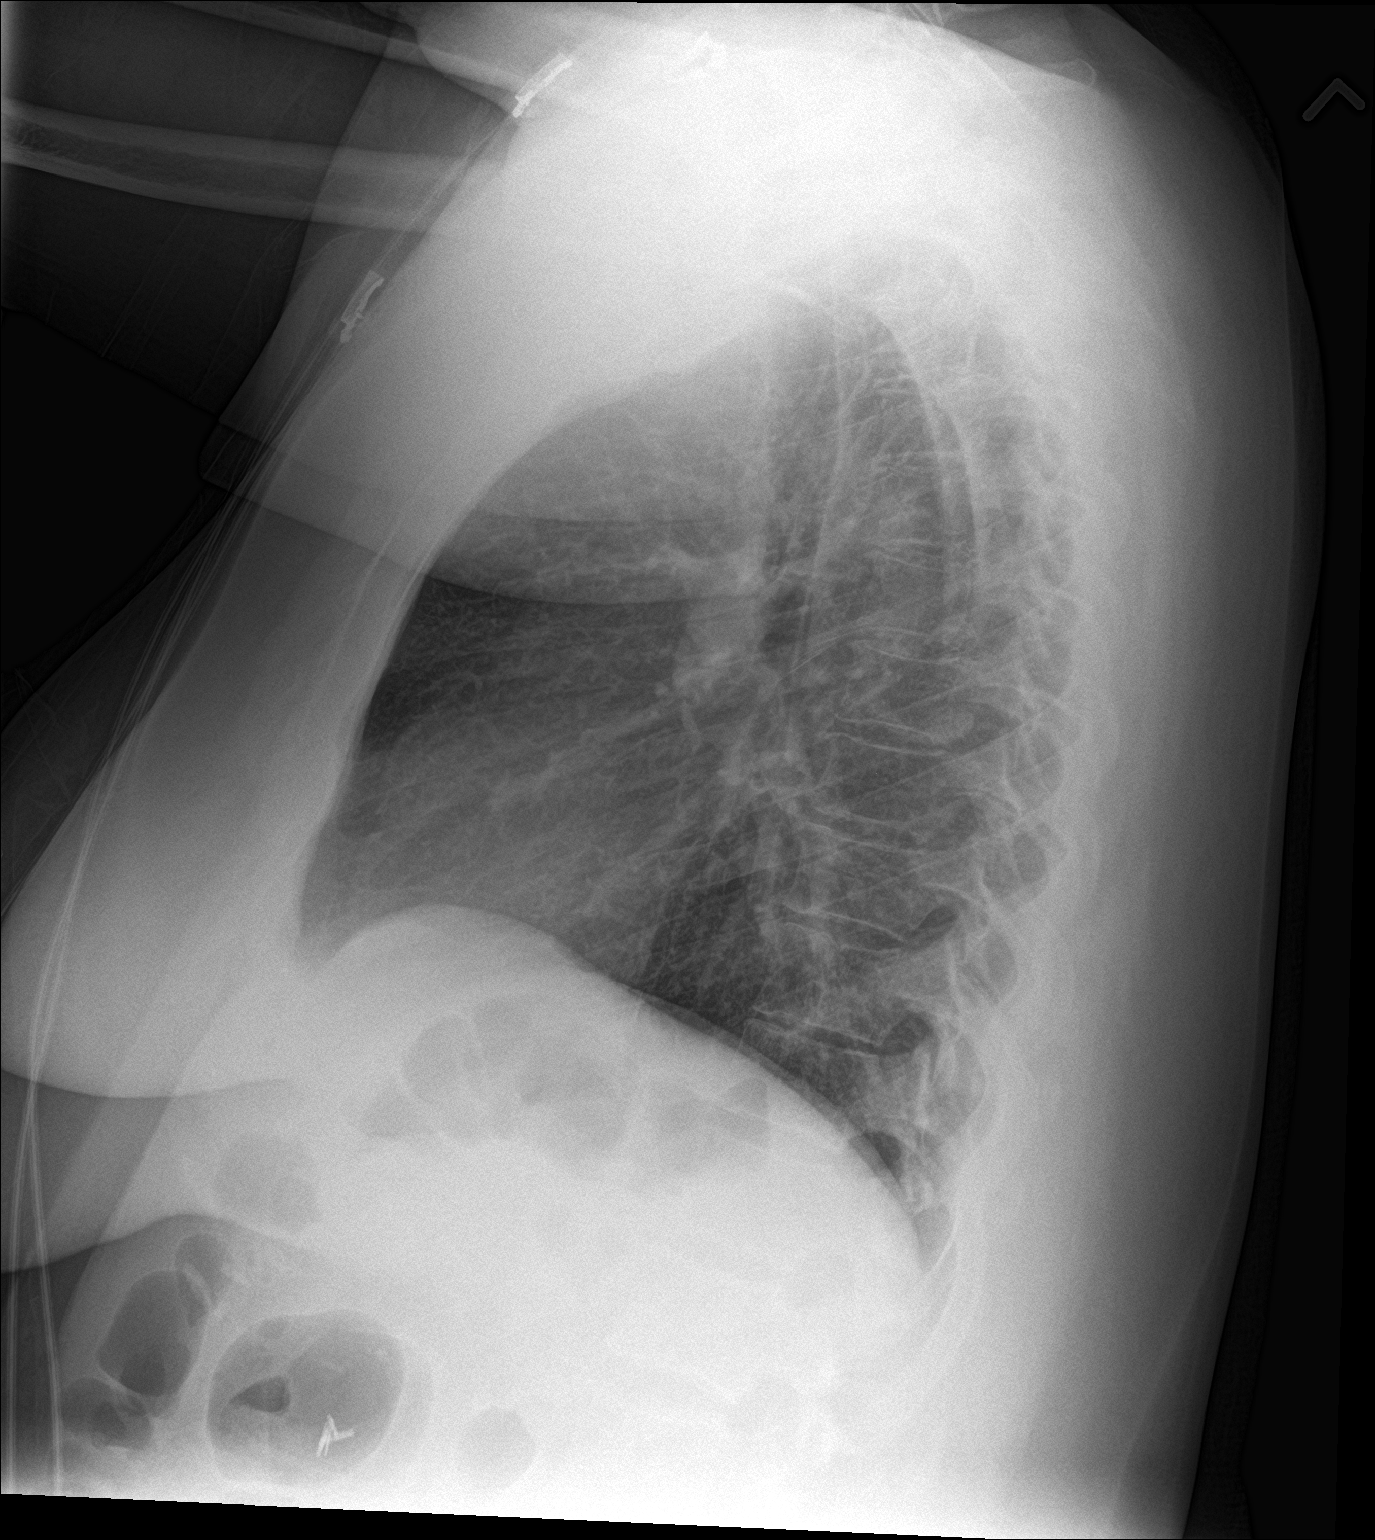

[2 of 2 positions shown; findings below may reference images not displayed]

FINDINGS: The heart size and mediastinal contours are within normal limits.
Both lungs are clear. No pleural effusion or pneumothorax. The
visualized skeletal structures are unremarkable.
IMPRESSION: No active cardiopulmonary disease.

## 2019-02-16 DIAGNOSIS — N611 Abscess of the breast and nipple: Secondary | ICD-10-CM | POA: Insufficient documentation

## 2019-09-08 ENCOUNTER — Encounter: Payer: Self-pay | Admitting: Podiatry

## 2019-09-08 ENCOUNTER — Ambulatory Visit: Payer: Medicaid Other | Admitting: Podiatry

## 2019-09-08 ENCOUNTER — Other Ambulatory Visit: Payer: Self-pay

## 2019-09-08 DIAGNOSIS — S90822A Blister (nonthermal), left foot, initial encounter: Secondary | ICD-10-CM

## 2019-09-08 DIAGNOSIS — L089 Local infection of the skin and subcutaneous tissue, unspecified: Secondary | ICD-10-CM

## 2019-09-08 MED ORDER — DOXYCYCLINE HYCLATE 100 MG PO TABS: 100.0000 mg | ORAL_TABLET | Freq: Two times a day (BID) | ORAL | 0 refills | Status: DC

## 2019-09-08 NOTE — Progress Notes (Signed)
  Subjective:  Patient ID: Hannah Bowman, female    DOB: 1970-05-16,  MRN: [id number]  Chief Complaint  Patient presents with  . Callouses    Patient presents today for painful callous lesion bottom of left sub 5th x 7 years    49 y.o. female presents with the above complaint.  Patient presents with a complaint of left submetatarsal 5 blister formation that has been painful for last couple of years.  Patient states the last couple months have been very painful.  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  She has not done anything for this.  She is try some soaking.  She is overall very healthy without any diabetes.  She denies any other acute complaints.  She would like to know and discuss treatment options.  She has not seen anyone else prior to seeing me.   Review of Systems: Negative except as noted in the HPI. Denies N/V/F/Ch.  Past Medical History:  Diagnosis Date  . Diverticulitis     Current Outpatient Medications:  .  acetaminophen (TYLENOL) 500 MG tablet, Take by mouth., Disp: , Rfl:  .  doxycycline (VIBRA-TABS) 100 MG tablet, Take 1 tablet (100 mg total) by mouth 2 (two) times daily., Disp: 28 tablet, Rfl: 0  Social History   Tobacco Use  Smoking Status Current Every Day Smoker  . Packs/day: 1.00  . Types: Cigarettes  Smokeless Tobacco Never Used    Allergies  Allergen Reactions  . Iodine Hives   Objective:  There were no vitals filed for this visit. There is no height or weight on file to calculate BMI. Constitutional Well developed. Well nourished.  Vascular Dorsalis pedis pulses palpable bilaterally. Posterior tibial pulses palpable bilaterally. Capillary refill normal to all digits.  No cyanosis or clubbing noted. Pedal hair growth normal.  Neurologic Normal speech. Oriented to person, place, and time. Epicritic sensation to light touch grossly present bilaterally.  Dermatologic Nails well groomed and normal in  appearance. No open wounds. No skin lesions.  Orthopedic:  Pain on palpation to the left submetatarsal 5 hyperkeratotic lesion.  Upon debridement of the lesion purulent drainage was expressed.  There is an ulceration with partial thickness down to the layer of the dermis with superficial purulent drainage expressed.  No redness noted.  No malodor present.   Radiographs: None Assessment:   1. Infected blister of left foot, initial encounter    Plan:  Patient was evaluated and treated and all questions answered.  Left submetatarsal 5 blister with underlying infection -I explained patient the etiology of blister and its relationship with the biomechanics of her foot and the aggressive nature here over wart on the foot.  Given that patient does not have any underlying diabetes or any other comorbidities this is likely due to long-term infected blister formation.  Purulent drainage was drained at bedside in standard technique.  I believe patient will benefit from a surgical shoe as well as triple antibiotic and a Band-Aid.  -Doxycycline was dispensed for skin and soft tissue prophylaxis No follow-ups on file.

## 2019-09-22 ENCOUNTER — Other Ambulatory Visit: Payer: Self-pay

## 2019-09-22 ENCOUNTER — Encounter: Payer: Self-pay | Admitting: Podiatry

## 2019-09-22 ENCOUNTER — Ambulatory Visit: Payer: Medicaid Other | Admitting: Podiatry

## 2019-09-22 DIAGNOSIS — S90822A Blister (nonthermal), left foot, initial encounter: Secondary | ICD-10-CM | POA: Diagnosis not present

## 2019-09-22 DIAGNOSIS — L089 Local infection of the skin and subcutaneous tissue, unspecified: Secondary | ICD-10-CM | POA: Diagnosis not present

## 2019-09-24 ENCOUNTER — Encounter: Payer: Self-pay | Admitting: Podiatry

## 2019-09-24 NOTE — Progress Notes (Signed)
  Subjective:  Patient ID: Hannah Bowman, female    DOB: October 11, 1970,  MRN: [id number]  Chief Complaint  Patient presents with  . Callouses    "its doing much better now"    49 y.o. female presents with the above complaint.  Patient presents with a follow-up of left submetatarsal 5 blister formation.  Patient states is doing a lot better.  He does not have any pain.  His blister has also completely resolved.  He denies any other acute complaints.   Review of Systems: Negative except as noted in the HPI. Denies N/V/F/Ch.  Past Medical History:  Diagnosis Date  . Diverticulitis     Current Outpatient Medications:  .  acetaminophen (TYLENOL) 500 MG tablet, Take by mouth., Disp: , Rfl:   Social History   Tobacco Use  Smoking Status Current Every Day Smoker  . Packs/day: 1.00  . Types: Cigarettes  Smokeless Tobacco Never Used    Allergies  Allergen Reactions  . Iodine Hives   Objective:  There were no vitals filed for this visit. There is no height or weight on file to calculate BMI. Constitutional Well developed. Well nourished.  Vascular Dorsalis pedis pulses palpable bilaterally. Posterior tibial pulses palpable bilaterally. Capillary refill normal to all digits.  No cyanosis or clubbing noted. Pedal hair growth normal.  Neurologic Normal speech. Oriented to person, place, and time. Epicritic sensation to light touch grossly present bilaterally.  Dermatologic Nails well groomed and normal in appearance. No open wounds. No skin lesions.  Orthopedic:  No pain on palpation to the left submetatarsal 5 hyperkeratotic lesion.  No purulent drainage noted ulceration resolved.   Radiographs: None Assessment:   1. Infected blister of left foot, initial encounter    Plan:  Patient was evaluated and treated and all questions answered.  Left submetatarsal 5 blister with underlying infection -Clinically healed.  Patient no longer has blister ration or any other sign  of infection.  She can return to normal activities without reservation.  I have asked her to come back and see me if any foot and ankle issues arises in the future. No follow-ups on file.

## 2020-06-10 ENCOUNTER — Other Ambulatory Visit: Payer: Self-pay

## 2020-06-10 ENCOUNTER — Ambulatory Visit
Admission: RE | Admit: 2020-06-10 | Discharge: 2020-06-10 | Disposition: A | Discharge status: Home / Self Care | Payer: Medicaid Other | Attending: Internal Medicine | Admitting: Internal Medicine

## 2020-06-10 ENCOUNTER — Other Ambulatory Visit: Payer: Self-pay | Admitting: Physician Assistant

## 2020-06-10 ENCOUNTER — Ambulatory Visit
Admission: RE | Admit: 2020-06-10 | Discharge: 2020-06-10 | Disposition: A | Discharge status: Home / Self Care | Payer: Medicaid Other | Source: Ambulatory Visit | Attending: Physician Assistant | Admitting: Physician Assistant

## 2020-06-10 DIAGNOSIS — M25541 Pain in joints of right hand: Secondary | ICD-10-CM | POA: Insufficient documentation

## 2020-06-10 DIAGNOSIS — M79641 Pain in right hand: Secondary | ICD-10-CM | POA: Insufficient documentation

## 2020-06-14 ENCOUNTER — Other Ambulatory Visit: Payer: Self-pay | Admitting: Physician Assistant

## 2020-06-14 DIAGNOSIS — Z1231 Encounter for screening mammogram for malignant neoplasm of breast: Secondary | ICD-10-CM

## 2020-10-26 ENCOUNTER — Emergency Department
Admission: EM | Admit: 2020-10-26 | Discharge: 2020-10-26 | Disposition: A | Discharge status: Home / Self Care | Payer: Medicaid Other | Attending: Emergency Medicine | Admitting: Emergency Medicine

## 2020-10-26 ENCOUNTER — Other Ambulatory Visit: Payer: Self-pay

## 2020-10-26 DIAGNOSIS — R509 Fever, unspecified: Secondary | ICD-10-CM | POA: Diagnosis present

## 2020-10-26 DIAGNOSIS — U071 COVID-19: Secondary | ICD-10-CM | POA: Insufficient documentation

## 2020-10-26 DIAGNOSIS — F17210 Nicotine dependence, cigarettes, uncomplicated: Secondary | ICD-10-CM | POA: Diagnosis not present

## 2020-10-26 DIAGNOSIS — J111 Influenza due to unidentified influenza virus with other respiratory manifestations: Secondary | ICD-10-CM | POA: Insufficient documentation

## 2020-10-26 MED ORDER — FAMOTIDINE 20 MG PO TABS: 20.0000 mg | ORAL_TABLET | Freq: Two times a day (BID) | ORAL | 0 refills | Status: AC

## 2020-10-26 MED ORDER — ONDANSETRON 4 MG PO TBDP: 4.0000 mg | ORAL_TABLET | Freq: Three times a day (TID) | ORAL | 0 refills | Status: AC | PRN

## 2020-10-26 MED ORDER — ONDANSETRON 8 MG PO TBDP
8.0000 mg | ORAL_TABLET | Freq: Once | ORAL | Status: AC
  Administered 2020-10-26: 8 mg via ORAL
  Filled 2020-10-26: qty 1

## 2020-10-26 MED ORDER — KETOROLAC TROMETHAMINE 60 MG/2ML IM SOLN
15.0000 mg | Freq: Once | INTRAMUSCULAR | Status: AC
  Administered 2020-10-26: 15 mg via INTRAMUSCULAR
  Filled 2020-10-26: qty 2

## 2020-10-26 MED ORDER — NAPROXEN 250 MG PO TABS: 500.0000 mg | ORAL_TABLET | Freq: Two times a day (BID) | ORAL | 0 refills | Status: AC

## 2020-10-26 NOTE — ED Provider Notes (Signed)
[facility] Emergency Department Provider Note  ____________________________________________  Time seen: Approximately 3:18 PM  I have reviewed the triage vital signs and the nursing notes.   HISTORY  Chief Complaint Covid Positive    HPI Hannah Bowman is a 50 y.o. female with a history of diverticulitis who comes ED complaining of nausea vomiting diarrhea body aches fever and generalized headache for the past 2 days.  Took a home test which was COVID-positive.  She has had 3 COVID vaccines.  Not on any immunosuppression.  Has been able to tolerate liquids but not eating today.  Pain is moderate, waxing waning, no aggravating or alleviating factors.  No hematemesis or bloody diarrhea    Past Medical History:  Diagnosis Date   Diverticulitis      Patient Active Problem List   Diagnosis Date Noted   Abscess of breast 02/16/2019   Open wound of right axillary region 04/01/2017   Abscess of axilla, right 03/25/2017   Infective myositis of right shoulder 03/25/2017   Diverticulitis 03/25/2017   Lung nodules 03/25/2017   Tobacco abuse disorder 03/25/2017   Chest pain 03/22/2016     Past Surgical History:  Procedure Laterality Date   ABDOMINAL HYSTERECTOMY     BREAST BIOPSY Left 2014   chronic inflammation with abscess   BREAST BIOPSY Right 09/04/2018   [place] bx at 6:00, two coil markers placed at site, path pending   BREAST CYST EXCISION Left 2019   axilla sebaceous cyst excised due to MRSA infection per pt   CESAREAN SECTION     CHOLECYSTECTOMY       Prior to Admission medications   Medication Sig Start Date End Date Taking? Authorizing Provider  famotidine (PEPCID) 20 MG tablet Take 1 tablet (20 mg total) by mouth 2 (two) times daily. 10/26/20  Yes Sharman Cheek, MD  naproxen (NAPROSYN) 250 MG tablet Take 2 tablets (500 mg total) by mouth 2 (two) times daily with a meal for 7 days. 10/26/20 11/02/20 Yes Sharman Cheek, MD   ondansetron (ZOFRAN ODT) 4 MG disintegrating tablet Take 1 tablet (4 mg total) by mouth every 8 (eight) hours as needed for nausea or vomiting. 10/26/20  Yes Sharman Cheek, MD  acetaminophen (TYLENOL) 500 MG tablet Take by mouth. 12/30/18   [provider]     Allergies Iodine   No family history on file.  Social History Social History   Tobacco Use   Smoking status: Every Day    Packs/day: 1.00    Types: Cigarettes   Smokeless tobacco: Never  Substance Use Topics   Alcohol use: No   Drug use: No    Review of Systems  Constitutional:   Positive fever and chills ENT:   Positive sore throat. No rhinorrhea. Cardiovascular:   No chest pain or syncope. Respiratory:   No dyspnea positive cough. Gastrointestinal:   Positive for abdominal pain, vomiting and diarrhea.  Musculoskeletal:   Negative for focal pain or swelling All other systems reviewed and are negative except as documented above in ROS and HPI.  ____________________________________________   PHYSICAL EXAM:  VITAL SIGNS: ED Triage Vitals  Enc Vitals Group     BP 10/26/20 1136 (!) 118/91     Pulse Rate 10/26/20 1136 65     Resp 10/26/20 1136 20     Temp 10/26/20 1136 98.8 F (37.1 C)     Temp Source 10/26/20 1136 Oral     SpO2 10/26/20 1136 97 %  Weight 10/26/20 1137 224 lb (101.6 kg)     Height 10/26/20 1137 5' 4.5" (1.638 m)     Head Circumference --      Peak Flow --      Pain Score 10/26/20 1137 10     Pain Loc --      Pain Edu? --      Excl. in GC? --     Vital signs reviewed, nursing assessments reviewed.   Constitutional:   Alert and oriented. Non-toxic appearance. Eyes:   Conjunctivae are normal. EOMI. PERRL. ENT      Head:   Normocephalic and atraumatic.      Nose:   Normal      Mouth/Throat: Moist mucosa, no tonsillar swelling      Neck:   No meningismus. Full ROM. Hematological/Lymphatic/Immunilogical:   No cervical lymphadenopathy. Cardiovascular:   RRR. Symmetric  bilateral radial and DP pulses.  No murmurs. Cap refill less than 2 seconds. Respiratory:   Normal respiratory effort without tachypnea/retractions. Breath sounds are clear and equal bilaterally. No wheezes/rales/rhonchi. Gastrointestinal:   Soft and nontender. Non distended. There is no CVA tenderness.  No rebound, rigidity, or guarding.  Musculoskeletal:   Normal range of motion in all extremities. No joint effusions.  No lower extremity tenderness.  No edema. Neurologic:   Normal speech and language.  Motor grossly intact. No acute focal neurologic deficits are appreciated.  Skin:    Skin is warm, dry and intact. No rash noted.  No petechiae, purpura, or bullae.  ____________________________________________    LABS (pertinent positives/negatives) (all labs ordered are listed, but only abnormal results are displayed) Labs Reviewed - No data to display ____________________________________________   EKG    ____________________________________________    RADIOLOGY  No results found.  ____________________________________________   PROCEDURES Procedures  ____________________________________________    CLINICAL IMPRESSION / ASSESSMENT AND PLAN / ED COURSE  Medications ordered in the ED: Medications  ondansetron (ZOFRAN-ODT) disintegrating tablet 8 mg (8 mg Oral Given 10/26/20 1256)  ketorolac (TORADOL) injection 15 mg (15 mg Intramuscular Given 10/26/20 1256)    Pertinent labs & imaging results that were available during my care of the patient were reviewed by me and considered in my medical decision making (see chart for details).  Hannah Bowman was evaluated in Emergency Department on 10/26/2020 for the symptoms described in the history of present illness. She was evaluated in the context of the global COVID-19 pandemic, which necessitated consideration that the patient might be at risk for infection with the SARS-CoV-2 virus that causes COVID-19. Institutional  protocols and algorithms that pertain to the evaluation of patients at risk for COVID-19 are in a state of rapid change based on information released by regulatory bodies including the CDC and federal and state organizations. These policies and algorithms were followed during the patient's care in the ED.   Patient presents with influenza-like illness, reports a positive home COVID test.  Vital signs unremarkable, exam is benign.  Not high risk.  Feeling better after p.o. Zofran and IM Toradol, tolerating p.o.  Stable for discharge.      ____________________________________________   FINAL CLINICAL IMPRESSION(S) / ED DIAGNOSES    Final diagnoses:  COVID-19 virus infection  Influenza-like illness     ED Discharge Orders          Ordered    ondansetron (ZOFRAN ODT) 4 MG disintegrating tablet  Every 8 hours PRN        10/26/20 1518    famotidine (PEPCID)  20 MG tablet  2 times daily        10/26/20 1518    naproxen (NAPROSYN) 250 MG tablet  2 times daily with meals        10/26/20 1518            Portions of this note were generated with dragon dictation software. Dictation errors may occur despite best attempts at proofreading.    Sharman Cheek, MD 10/26/20 1520

## 2020-10-26 NOTE — ED Triage Notes (Signed)
Pt via POV from home. Pt is COVID positive. Pt c/o nausea, emesis, diarrhea, body aches, fever and headache. Pt has been feeling COVID positive, pt took an at home COVID test and it was positive. States 101.1 at 3:00am today. Pt took Ibuprofen. Denies cough/SOB. Pt is A&Ox4 and NAD.

## 2020-10-26 NOTE — ED Notes (Signed)
See triage note  Presents with h/a body aches and subjective   states sx's started yesterday  afebrile on arrival  states she has had the vaccines

## 2020-10-30 ENCOUNTER — Emergency Department: Payer: Medicaid Other

## 2020-10-30 ENCOUNTER — Emergency Department
Admission: EM | Admit: 2020-10-30 | Discharge: 2020-10-30 | Disposition: A | Discharge status: Home / Self Care | Payer: Medicaid Other | Attending: Emergency Medicine | Admitting: Emergency Medicine

## 2020-10-30 ENCOUNTER — Other Ambulatory Visit: Payer: Self-pay

## 2020-10-30 DIAGNOSIS — F17210 Nicotine dependence, cigarettes, uncomplicated: Secondary | ICD-10-CM | POA: Diagnosis not present

## 2020-10-30 DIAGNOSIS — U071 COVID-19: Secondary | ICD-10-CM | POA: Insufficient documentation

## 2020-10-30 DIAGNOSIS — E860 Dehydration: Secondary | ICD-10-CM | POA: Diagnosis not present

## 2020-10-30 DIAGNOSIS — R519 Headache, unspecified: Secondary | ICD-10-CM | POA: Diagnosis not present

## 2020-10-30 LAB — URINALYSIS, COMPLETE (UACMP) WITH MICROSCOPIC
Bilirubin Urine: NEGATIVE
Glucose, UA: NEGATIVE mg/dL
Hgb urine dipstick: NEGATIVE
Ketones, ur: NEGATIVE mg/dL
Leukocytes,Ua: NEGATIVE
Nitrite: NEGATIVE
Protein, ur: NEGATIVE mg/dL
Specific Gravity, Urine: 1.025 (ref 1.005–1.030)
pH: 5.5 (ref 5.0–8.0)

## 2020-10-30 LAB — COMPREHENSIVE METABOLIC PANEL
ALT: 26 U/L (ref 0–44)
AST: 20 U/L (ref 15–41)
Albumin: 3.2 g/dL — ABNORMAL LOW (ref 3.5–5.0)
Alkaline Phosphatase: 39 U/L (ref 38–126)
Anion gap: 8 (ref 5–15)
BUN: 20 mg/dL (ref 6–20)
CO2: 29 mmol/L (ref 22–32)
Calcium: 8.2 mg/dL — ABNORMAL LOW (ref 8.9–10.3)
Chloride: 106 mmol/L (ref 98–111)
Creatinine, Ser: 0.59 mg/dL (ref 0.44–1.00)
GFR, Estimated: >60 mL/min (ref >60)
Glucose, Bld: 94 mg/dL (ref 70–99)
Potassium: 4.4 mmol/L (ref 3.5–5.1)
Sodium: 143 mmol/L (ref 135–145)
Total Bilirubin: 0.4 mg/dL (ref 0.3–1.2)
Total Protein: 5.7 g/dL — ABNORMAL LOW (ref 6.5–8.1)

## 2020-10-30 LAB — CBC WITH DIFFERENTIAL/PLATELET
Abs Immature Granulocytes: 0.02 10*3/uL (ref 0.00–0.07)
Basophils Absolute: 0 10*3/uL (ref 0.0–0.1)
Basophils Relative: 0 %
Eosinophils Absolute: 0 10*3/uL (ref 0.0–0.5)
Eosinophils Relative: 0 %
HCT: 46.7 % — ABNORMAL HIGH (ref 36.0–46.0)
Hemoglobin: 15.6 g/dL — ABNORMAL HIGH (ref 12.0–15.0)
Immature Granulocytes: 0 %
Lymphocytes Relative: 15 %
Lymphs Abs: 1.2 10*3/uL (ref 0.7–4.0)
MCH: 31.1 pg (ref 26.0–34.0)
MCHC: 33.4 g/dL (ref 30.0–36.0)
MCV: 93.2 fL (ref 80.0–100.0)
Monocytes Absolute: 0.5 10*3/uL (ref 0.1–1.0)
Monocytes Relative: 6 %
Neutro Abs: 6.7 10*3/uL (ref 1.7–7.7)
Neutrophils Relative %: 79 %
Platelets: 152 10*3/uL (ref 150–400)
RBC: 5.01 MIL/uL (ref 3.87–5.11)
RDW: 12.8 % (ref 11.5–15.5)
WBC: 8.5 10*3/uL (ref 4.0–10.5)
nRBC: 0 % (ref 0.0–0.2)

## 2020-10-30 MED ORDER — PROMETHAZINE HCL 25 MG PO TABS
25.0000 mg | ORAL_TABLET | Freq: Once | ORAL | Status: AC
  Administered 2020-10-30: 25 mg via ORAL
  Filled 2020-10-30: qty 1

## 2020-10-30 MED ORDER — SODIUM CHLORIDE 0.9 % IV BOLUS
1000.0000 mL | Freq: Once | INTRAVENOUS | Status: AC
  Administered 2020-10-30: 1000 mL via INTRAVENOUS

## 2020-10-30 MED ORDER — MORPHINE SULFATE (PF) 4 MG/ML IV SOLN
4.0000 mg | Freq: Once | INTRAVENOUS | Status: AC
  Administered 2020-10-30: 4 mg via INTRAVENOUS
  Filled 2020-10-30: qty 1

## 2020-10-30 MED ORDER — ONDANSETRON HCL 4 MG/2ML IJ SOLN
4.0000 mg | Freq: Once | INTRAMUSCULAR | Status: AC
  Administered 2020-10-30: 4 mg via INTRAVENOUS
  Filled 2020-10-30: qty 2

## 2020-10-30 MED ORDER — ONDANSETRON HCL 4 MG/2ML IJ SOLN
4.0000 mg | Freq: Once | INTRAMUSCULAR | Status: AC
Start: 2020-10-30 — End: 2020-10-30
  Administered 2020-10-30: 4 mg via INTRAVENOUS
  Filled 2020-10-30: qty 2

## 2020-10-30 MED ORDER — ACETAMINOPHEN 325 MG PO TABS
650.0000 mg | ORAL_TABLET | Freq: Once | ORAL | Status: AC
  Administered 2020-10-30: 650 mg via ORAL
  Filled 2020-10-30: qty 2

## 2020-10-30 NOTE — ED Triage Notes (Signed)
Pt states she started having a headache this AM- pt denies taking any OTC- pt states she tested positive for covid either Tuesday or Wednesday

## 2020-10-30 NOTE — ED Triage Notes (Signed)
Pt in via EMS from home with c/o headache. Pt reports woke up this am with a HA and tested positive for covid 7 days ago.

## 2020-10-30 NOTE — Discharge Instructions (Addendum)

## 2020-10-30 NOTE — ED Provider Notes (Signed)
[facility] Emergency Department Provider Note  ____________________________________________   Event Date/Time   First MD Initiated Contact with Patient 10/30/20 [phone]     (approximate)  I have reviewed the triage vital signs and the nursing notes.   HISTORY  Chief Complaint Migraine    HPI Hannah Bowman is a 50 y.o. female presents emergency department via EMS from home.  Woke up with a severe headache.  Patient states that is the worst of her life.  History of covid x5 days.  Took a home test that was positive and was seen here following.  States we did not repeat her COVID test.  Explained to her if a home test was positive that means she is positive.  She denies any nausea/vomiting.  Has had diarrhea.  States she slept most of the day yesterday so unsure if she is getting the proper amount of fluids.  Has some fever and chills.  No chest pain or shortness of breath.  Past Medical History:  Diagnosis Date   Diverticulitis     Patient Active Problem List   Diagnosis Date Noted   Abscess of breast 02/16/2019   Open wound of right axillary region 04/01/2017   Abscess of axilla, right 03/25/2017   Infective myositis of right shoulder 03/25/2017   Diverticulitis 03/25/2017   Lung nodules 03/25/2017   Tobacco abuse disorder 03/25/2017   Chest pain 03/22/2016    Past Surgical History:  Procedure Laterality Date   ABDOMINAL HYSTERECTOMY     BREAST BIOPSY Left 2014   chronic inflammation with abscess   BREAST BIOPSY Right 09/04/2018   [place] bx at 6:00, two coil markers placed at site, path pending   BREAST CYST EXCISION Left 2019   axilla sebaceous cyst excised due to MRSA infection per pt   CESAREAN SECTION     CHOLECYSTECTOMY      Prior to Admission medications   Medication Sig Start Date End Date Taking? Authorizing Provider  acetaminophen (TYLENOL) 500 MG tablet Take by mouth. 12/30/18   [provider]  famotidine (PEPCID) 20  MG tablet Take 1 tablet (20 mg total) by mouth 2 (two) times daily. 10/26/20   Sharman Cheek, MD  naproxen (NAPROSYN) 250 MG tablet Take 2 tablets (500 mg total) by mouth 2 (two) times daily with a meal for 7 days. 10/26/20 11/02/20  Sharman Cheek, MD  ondansetron (ZOFRAN ODT) 4 MG disintegrating tablet Take 1 tablet (4 mg total) by mouth every 8 (eight) hours as needed for nausea or vomiting. 10/26/20   Sharman Cheek, MD    Allergies Iodine  No family history on file.  Social History Social History   Tobacco Use   Smoking status: Every Day    Packs/day: 1.00    Types: Cigarettes   Smokeless tobacco: Never  Substance Use Topics   Alcohol use: No   Drug use: No    Review of Systems  Constitutional: Positive fever/chills Eyes: No visual changes. ENT: No sore throat. Respiratory: Denies cough Cardiovascular: Denies chest pain Gastrointestinal: Denies abdominal pain, positive diarrhea Genitourinary: Negative for dysuria. Musculoskeletal: Negative for back pain. Skin: Negative for rash. Psychiatric: no mood changes,     ____________________________________________   PHYSICAL EXAM:  VITAL SIGNS: ED Triage Vitals  Enc Vitals Group     BP 10/30/20 0820 127/88     Pulse Rate 10/30/20 0820 62     Resp 10/30/20 0820 18     Temp 10/30/20 0820 98.3 F (36.8 C)  Temp Source 10/30/20 0820 Oral     SpO2 10/30/20 0820 97 %     Weight 10/30/20 0818 230 lb (104.3 kg)     Height 10/30/20 0818 5\' 4"  (1.626 m)     Head Circumference --      Peak Flow --      Pain Score 10/30/20 0817 10     Pain Loc --      Pain Edu? --      Excl. in GC? --     Constitutional: Alert and oriented. Well appearing and in no acute distress. Eyes: Conjunctivae are normal.  PERRL Head: Atraumatic. Nose: No congestion/rhinnorhea. Mouth/Throat: Mucous membranes are moist.   Neck:  supple no lymphadenopathy noted Cardiovascular: Normal rate, regular rhythm. Heart sounds are  normal Respiratory: Normal respiratory effort.  No retractions, lungs c t a  Abd: soft nontender bs normal all 4 quad GU: deferred Musculoskeletal: FROM all extremities, warm and well perfused Neurologic:  Normal speech and language.  No slurred speech, grips are equal bilaterally, cranial nerves II through XII grossly intact Skin:  Skin is warm, dry and intact. No rash noted. Psychiatric: Mood and affect are normal. Speech and behavior are normal.  ____________________________________________   LABS (all labs ordered are listed, but only abnormal results are displayed)  Labs Reviewed  COMPREHENSIVE METABOLIC PANEL - Abnormal; Notable for the following components:      Result Value   Calcium 8.2 (*)    Total Protein 5.7 (*)    Albumin 3.2 (*)    All other components within normal limits  CBC WITH DIFFERENTIAL/PLATELET - Abnormal; Notable for the following components:   Hemoglobin 15.6 (*)    HCT 46.7 (*)    All other components within normal limits  URINALYSIS, COMPLETE (UACMP) WITH MICROSCOPIC - Abnormal; Notable for the following components:   Bacteria, UA MANY (*)    All other components within normal limits   ____________________________________________   ____________________________________________  RADIOLOGY  CT of the head  ____________________________________________   PROCEDURES  Procedure(s) performed: No  Procedures    ____________________________________________   INITIAL IMPRESSION / ASSESSMENT AND PLAN / ED COURSE  Pertinent labs & imaging results that were available during my care of the patient were reviewed by me and considered in my medical decision making (see chart for details).   The patient is a 50 year old female presents with headache and is COVID-positive.  See HPI.  Physical exam shows patient appears stable  DDx: SAH, subdural, migraine, CVA, viral meningitis, dehydration, COVID  Labs and imaging ordered, patient will be given  normal saline 1 L IV, Zofran, and a Tylenol.  CT of the head reviewed by me confirmed by radiology to be negative for any acute abnormality  Labs are reassuring, H&H are a little elevated indicating she may be dehydrated.  I did explain this to the patient.  She is to drink plenty of fluids to stay hydrated.  Told her it is especially important since she has covid.  She was given other over-the-counter measures to take while having COVID.  Patient is on day 6 of her illness, therefore paxlovid would not be appropriate at this time.  She is to continue take Tylenol/ibuprofen for pain as needed.  Return emergency department worsening.  She was discharged in stable condition.  MERSEDES ALBER was evaluated in Emergency Department on 10/30/2020 for the symptoms described in the history of present illness. She was evaluated in the context of the global COVID-19 pandemic, which  necessitated consideration that the patient might be at risk for infection with the SARS-CoV-2 virus that causes COVID-19. Institutional protocols and algorithms that pertain to the evaluation of patients at risk for COVID-19 are in a state of rapid change based on information released by regulatory bodies including the CDC and federal and state organizations. These policies and algorithms were followed during the patient's care in the ED.    As part of my medical decision making, I reviewed the following data within the electronic MEDICAL RECORD NUMBER Nursing notes reviewed and incorporated, Labs reviewed , Old chart reviewed, Radiograph reviewed , Notes from prior ED visits, and [place] Controlled Substance Database  ____________________________________________   FINAL CLINICAL IMPRESSION(S) / ED DIAGNOSES  Final diagnoses:  Bad headache  Dehydration      NEW MEDICATIONS STARTED DURING THIS VISIT:  New Prescriptions   No medications on file     Note:  This document was prepared using Dragon voice recognition software and  may include unintentional dictation errors.    Faythe Ghee, PA-C 10/30/20 1103    Dionne Bucy, MD 10/30/20 1549

## 2020-10-30 NOTE — ED Notes (Signed)
States she is waiting for her ride  feeling nauseated  provider aware

## 2020-10-30 NOTE — ED Notes (Signed)
See triage note  presents via EMS with h/a this am   states pain is mainly frontal and moves to back of head  no fever or trauma  denies any n/v

## 2022-07-13 ENCOUNTER — Other Ambulatory Visit: Payer: Self-pay

## 2022-07-13 ENCOUNTER — Emergency Department
Admission: EM | Admit: 2022-07-13 | Discharge: 2022-07-13 | Disposition: A | Discharge status: Home / Self Care | Payer: Medicaid Other | Attending: Emergency Medicine | Admitting: Emergency Medicine

## 2022-07-13 ENCOUNTER — Emergency Department: Payer: Medicaid Other

## 2022-07-13 DIAGNOSIS — S6992XA Unspecified injury of left wrist, hand and finger(s), initial encounter: Secondary | ICD-10-CM | POA: Diagnosis present

## 2022-07-13 DIAGNOSIS — S62367A Nondisplaced fracture of neck of fifth metacarpal bone, left hand, initial encounter for closed fracture: Secondary | ICD-10-CM | POA: Diagnosis not present

## 2022-07-13 DIAGNOSIS — W1839XA Other fall on same level, initial encounter: Secondary | ICD-10-CM | POA: Insufficient documentation

## 2022-07-13 NOTE — Discharge Instructions (Signed)
Please wear splint at all times until you follow-up with orthopedics.  Alternate Tylenol and ibuprofen as needed for pain.  Return to the ER for any worsening symptoms or any urgent changes in your health

## 2022-07-13 NOTE — ED Triage Notes (Signed)
Pt to ed from home for left hand pain. [place] on her left hand. Pt denies hitting her head and any other injury. Pt is caox4, in no acute distress and ambulatory In triage. Pt able to make a fist and has ROM and pulses in left hand. Injury occurred yesterday.

## 2022-07-13 NOTE — ED Provider Notes (Addendum)
[place] EMERGENCY DEPARTMENT AT [facility] Provider Note   CSN: [id number] Arrival date & time: 07/13/22  1750     History  Chief Complaint  Patient presents with   Hand Injury    LFT    Hannah Bowman is a 52 y.o. female.  Presents to the emergency department valuation of left injury.  [place], [place] and [place] on her closed hand suffering a left fifth metacarpal discomfort.  She has swelling along the left distal fifth metacarpal.  She has a history of prior left distal metacarpal fracture years ago.  Pain is controlled she describes mild soreness.  She is right-hand dominant  HPI     Home Medications Prior to Admission medications   Medication Sig Start Date End Date Taking? Authorizing Provider  acetaminophen (TYLENOL) 500 MG tablet Take by mouth. 12/30/18   [provider]  famotidine (PEPCID) 20 MG tablet Take 1 tablet (20 mg total) by mouth 2 (two) times daily. 10/26/20   Sharman Cheek, MD  ondansetron (ZOFRAN ODT) 4 MG disintegrating tablet Take 1 tablet (4 mg total) by mouth every 8 (eight) hours as needed for nausea or vomiting. 10/26/20   Sharman Cheek, MD      Allergies    Iodine    Review of Systems   Review of Systems  Physical Exam Updated Vital Signs BP (!) 98/57   Pulse 92   Temp 98 F (36.7 C) (Oral)   Resp 16   Ht 5\' 4"  (1.626 m)   Wt 106.6 kg   SpO2 98%   BMI 40.34 kg/m  Physical Exam Constitutional:      Appearance: She is well-developed.  HENT:     Head: Normocephalic and atraumatic.  Eyes:     Conjunctiva/sclera: Conjunctivae normal.  Cardiovascular:     Rate and Rhythm: Normal rate.  Pulmonary:     Effort: Pulmonary effort is normal. No respiratory distress.  Musculoskeletal:        General: Normal range of motion.     Cervical back: Normal range of motion.     Comments: No skin breakdown in the left upper extremity.  She has swelling to the distal fifth  metacarpal region.  She is tender palpation along the left fifth distal metacarpal.  No angulation or rotational deformity.  Skin:    General: Skin is warm.     Findings: No rash.  Neurological:     Mental Status: She is alert and oriented to person, place, and time.  Psychiatric:        Behavior: Behavior normal.        Thought Content: Thought content normal.     ED Results / Procedures / Treatments   Labs (all labs ordered are listed, but only abnormal results are displayed) Labs Reviewed - No data to display  EKG None  Radiology DG Hand Complete Left  Result Date: 07/13/2022 CLINICAL DATA:  Left hand pain. Fell in [place] and landed on left hand. Majority of pain in fourth finger. Pain shoots down arm with movement. EXAM: LEFT HAND - COMPLETE 3+ VIEW COMPARISON:  None Available. FINDINGS: Neutral ulnar variance. Mild thumb carpometacarpal joint space narrowing and peripheral osteophytosis. There is chronic irregularity indicating an old healed fracture of the distal shaft of the fifth metacarpal. An acute distal fifth metacarpal fracture with palmar angulation is described on report of remote 02/07/2011 radiograph (images unavailable). There is a linear lucency within the distal fifth metacarpal  neck in the region of the prior remote healed fracture. No displacement. Mild second through fifth DIP joint space narrowing. IMPRESSION: 1. Old healed fracture of the distal fifth metacarpal shaft. This fracture was described acutely in the report from 02/07/2011 prior left hand radiographs (images unavailable). 2. There is linear lucency that could represent an acute nondisplaced fracture within the distal fifth metacarpal neck in the region of the prior old healed fracture. However there appears to be some sclerosis across this lucency, and it may represent the chronic residua of the prior remote fracture. Recommend clinical correlation for point tenderness. Electronically Signed   By: Neita Garnet M.D.   On: 07/13/2022 18:21    Procedures .Ortho Injury Treatment  Date/Time: 07/13/2022 7:23 PM  Performed by: Evon Slack, PA-C Authorized by: Evon Slack, PA-C   Consent:    Consent obtained:  Verbal   Consent given by:  PatientPre-procedure neurovascular assessment: neurovascularly intact  Anesthesia: Local anesthesia used: no  Patient sedated: NoSplint type: ulnar gutter Splint Applied by: ED Provider Supplies used: cotton padding, elastic bandage and Ortho-Glass Post-procedure neurovascular assessment: post-procedure neurovascularly intact Post-procedure distal perfusion: normal Post-procedure neurological function: normal Post-procedure range of motion: normal       Medications Ordered in ED Medications - No data to display  ED Course/ Medical Decision Making/ A&P                             Medical Decision Making Amount and/or Complexity of Data Reviewed Radiology: ordered.   52 year old with left fifth metacarpal neck fracture.  Patient with chronic underlying fracture along the left fifth metacarpal neck.  Patient appears well with no skin breakdown noted.  There is no rotational deformity.  Patient placed into a ulnar gutter splint.  Pain well-controlled.  Advised to take Tylenol and ibuprofen as needed.  Patient will follow-up with orthopedics.  She understands signs symptoms return to the ER for. Final Clinical Impression(s) / ED Diagnoses Final diagnoses:  Nondisplaced fracture of neck of fifth metacarpal bone, left hand, initial encounter for closed fracture    Rx / DC Orders ED Discharge Orders     None         Evon Slack, PA-C 07/13/22 1924    Evon Slack, PA-C 07/13/22 1925    Dionne Bucy, MD 07/13/22 1944

## 2022-07-16 DIAGNOSIS — S92352A Displaced fracture of fifth metatarsal bone, left foot, initial encounter for closed fracture: Secondary | ICD-10-CM | POA: Diagnosis not present

## 2022-07-26 DIAGNOSIS — S92352A Displaced fracture of fifth metatarsal bone, left foot, initial encounter for closed fracture: Secondary | ICD-10-CM | POA: Diagnosis not present

## 2023-06-30 ENCOUNTER — Emergency Department

## 2023-06-30 ENCOUNTER — Emergency Department
Admission: EM | Admit: 2023-06-30 | Discharge: 2023-06-30 | Disposition: A | Discharge status: Home / Self Care | Attending: Emergency Medicine | Admitting: Emergency Medicine

## 2023-06-30 ENCOUNTER — Encounter: Payer: Self-pay | Admitting: Intensive Care

## 2023-06-30 ENCOUNTER — Other Ambulatory Visit: Payer: Self-pay

## 2023-06-30 DIAGNOSIS — L03211 Cellulitis of face: Secondary | ICD-10-CM | POA: Insufficient documentation

## 2023-06-30 DIAGNOSIS — R600 Localized edema: Secondary | ICD-10-CM | POA: Diagnosis present

## 2023-06-30 HISTORY — DX: Bell's palsy: G51.0

## 2023-06-30 LAB — CBC WITH DIFFERENTIAL/PLATELET
Abs Immature Granulocytes: 0.02 10*3/uL (ref 0.00–0.07)
Basophils Absolute: 0 10*3/uL (ref 0.0–0.1)
Basophils Relative: 1 %
Eosinophils Absolute: 0.1 10*3/uL (ref 0.0–0.5)
Eosinophils Relative: 1 %
HCT: 45.6 % (ref 36.0–46.0)
Hemoglobin: 15.2 g/dL — ABNORMAL HIGH (ref 12.0–15.0)
Immature Granulocytes: 0 %
Lymphocytes Relative: 17 %
Lymphs Abs: 1.3 10*3/uL (ref 0.7–4.0)
MCH: 30.2 pg (ref 26.0–34.0)
MCHC: 33.3 g/dL (ref 30.0–36.0)
MCV: 90.7 fL (ref 80.0–100.0)
Monocytes Absolute: 0.5 10*3/uL (ref 0.1–1.0)
Monocytes Relative: 7 %
Neutro Abs: 5.6 10*3/uL (ref 1.7–7.7)
Neutrophils Relative %: 74 %
Platelets: 213 10*3/uL (ref 150–400)
RBC: 5.03 MIL/uL (ref 3.87–5.11)
RDW: 12.4 % (ref 11.5–15.5)
WBC: 7.6 10*3/uL (ref 4.0–10.5)
nRBC: 0 % (ref 0.0–0.2)

## 2023-06-30 LAB — COMPREHENSIVE METABOLIC PANEL WITH GFR
ALT: 41 U/L (ref 0–44)
AST: 30 U/L (ref 15–41)
Albumin: 3.8 g/dL (ref 3.5–5.0)
Alkaline Phosphatase: 59 U/L (ref 38–126)
Anion gap: 9 (ref 5–15)
BUN: 21 mg/dL — ABNORMAL HIGH (ref 6–20)
CO2: 23 mmol/L (ref 22–32)
Calcium: 8.9 mg/dL (ref 8.9–10.3)
Chloride: 105 mmol/L (ref 98–111)
Creatinine, Ser: 0.71 mg/dL (ref 0.44–1.00)
GFR, Estimated: >60 mL/min (ref >60)
Glucose, Bld: 91 mg/dL (ref 70–99)
Potassium: 4.1 mmol/L (ref 3.5–5.1)
Sodium: 137 mmol/L (ref 135–145)
Total Bilirubin: 0.4 mg/dL (ref 0.0–1.2)
Total Protein: 7.2 g/dL (ref 6.5–8.1)

## 2023-06-30 MED ORDER — ONDANSETRON HCL 4 MG/2ML IJ SOLN
4.0000 mg | Freq: Once | INTRAMUSCULAR | Status: AC
  Administered 2023-06-30: 4 mg via INTRAVENOUS
  Filled 2023-06-30: qty 2

## 2023-06-30 MED ORDER — MORPHINE SULFATE (PF) 4 MG/ML IV SOLN
4.0000 mg | Freq: Once | INTRAVENOUS | Status: AC
  Administered 2023-06-30: 4 mg via INTRAVENOUS
  Filled 2023-06-30: qty 1

## 2023-06-30 MED ORDER — METHYLPREDNISOLONE SODIUM SUCC 40 MG IJ SOLR
40.0000 mg | Freq: Once | INTRAMUSCULAR | Status: AC
  Administered 2023-06-30: 40 mg via INTRAVENOUS
  Filled 2023-06-30: qty 1

## 2023-06-30 MED ORDER — SULFAMETHOXAZOLE-TRIMETHOPRIM 800-160 MG PO TABS: 1.0000 | ORAL_TABLET | Freq: Two times a day (BID) | ORAL | 0 refills | Status: AC

## 2023-06-30 MED ORDER — DIPHENHYDRAMINE HCL 50 MG/ML IJ SOLN
50.0000 mg | Freq: Once | INTRAMUSCULAR | Status: AC
  Administered 2023-06-30: 50 mg via INTRAVENOUS
  Filled 2023-06-30: qty 1

## 2023-06-30 MED ORDER — IOHEXOL 300 MG/ML  SOLN
75.0000 mL | Freq: Once | INTRAMUSCULAR | Status: AC | PRN
  Administered 2023-06-30: 75 mL via INTRAVENOUS

## 2023-06-30 MED ORDER — FENTANYL CITRATE PF 50 MCG/ML IJ SOSY
50.0000 ug | PREFILLED_SYRINGE | Freq: Once | INTRAMUSCULAR | Status: AC
  Administered 2023-06-30: 50 ug via INTRAVENOUS
  Filled 2023-06-30: qty 1

## 2023-06-30 MED ORDER — DIPHENHYDRAMINE HCL 25 MG PO CAPS: 50.0000 mg | ORAL_CAPSULE | Freq: Once | ORAL | Status: AC

## 2023-06-30 MED ORDER — SODIUM CHLORIDE 0.9 % IV SOLN
1.0000 g | Freq: Once | INTRAVENOUS | Status: AC
  Administered 2023-06-30: 1 g via INTRAVENOUS
  Filled 2023-06-30: qty 10

## 2023-06-30 MED ORDER — HYDROCODONE-ACETAMINOPHEN 5-325 MG PO TABS: 1.0000 | ORAL_TABLET | Freq: Four times a day (QID) | ORAL | 0 refills | Status: AC | PRN

## 2023-06-30 NOTE — ED Notes (Signed)
 Patient transported to CT

## 2023-06-30 NOTE — ED Triage Notes (Signed)
 Patient woke up yesterday with swelling around left eye. 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

## 2023-06-30 NOTE — Discharge Instructions (Signed)
 Take the antibiotic as prescribed and until finished.  If you take the pain medication please do not drive or operate machinery for 8 hours after the last dose.  Follow-up with primary care if not improving over the next 2 to 3 days.  If symptoms change or worsen, please return to the emergency department.

## 2023-06-30 NOTE — ED Provider Notes (Signed)
 [facility] Provider Note    Event Date/Time   First MD Initiated Contact with Patient 06/30/23 1143     (approximate)   History   Facial Swelling   HPI  Hannah Bowman is a 53 y.o. female  with history of abscess, bells palsy, diverticulitis and as listed in EMR presents to the emergency department for treatment and evaluation of pain and swelling to the left side of her face.  Symptoms started yesterday morning.  She has some swelling under the left eye and left side of her face.  She has a area of pain in the left upper jaw area.  She feels that the swelling is causing her sinuses on the left side to be blocked and feels that her nasal passages swollen causing her to not be able to breathe through that side of her nose.  She feels a strange sensation on the left side of her tongue but relates that to the degree of swelling.  She denies headache, vision changes, weakness of extremities, dizziness, confusion, subjective fever.  Symptoms have progressively worsened since yesterday.      Physical Exam   Triage Vital Signs: ED Triage Vitals [06/30/23 1137]  Encounter Vitals Group     BP (!) 140/94     Systolic BP Percentile      Diastolic BP Percentile      Pulse Rate 74     Resp 16     Temp 98.3 F (36.8 C)     Temp Source Oral     SpO2 97 %     Weight 240 lb (108.9 kg)     Height 5' 4.5" (1.638 m)     Head Circumference      Peak Flow      Pain Score 8     Pain Loc      Pain Education      Exclude from Growth Chart     Most recent vital signs: Vitals:   06/30/23 1531 06/30/23 1534  BP: 132/71   Pulse: 94   Resp: 16   Temp:  98.2 F (36.8 C)  SpO2: 95%     General: Awake, no distress.  CV:  Good peripheral perfusion.  Resp:  Normal effort.  Abd:  No distention.  Other:  Cranial nerves II through XII normal as tested.  Tongue protrudes midline speech is clear.  Grip strength is equal bilaterally.  Strength of the lower  extremities is 5 out of 5.  Normal motor and sensory function.  Tender, palpable area over the left mid face and under the angle of the left jaw.  No surrounding erythema, induration, or fluctuance.  Poor oral hygiene.  Missing and chronic appearing dental decay widespread.   ED Results / Procedures / Treatments   Labs (all labs ordered are listed, but only abnormal results are displayed) Labs Reviewed  COMPREHENSIVE METABOLIC PANEL WITH GFR - Abnormal; Notable for the following components:      Result Value   BUN 21 (*)    All other components within normal limits  CBC WITH DIFFERENTIAL/PLATELET - Abnormal; Notable for the following components:   Hemoglobin 15.2 (*)    All other components within normal limits     EKG  Not indicated   RADIOLOGY  Image and radiology report reviewed and interpreted by me. Radiology report consistent with the same.  CT head without contrast is negative for acute concerns.  CT maxillofacial with contrast suggest facial cellulitis.  No evidence of focal abscess.  PROCEDURES:  Critical Care performed: No  Procedures   MEDICATIONS ORDERED IN ED:  Medications  cefTRIAXone (ROCEPHIN) 1 g in sodium chloride  0.9 % 100 mL IVPB (has no administration in time range)  fentaNYL (SUBLIMAZE) injection 50 mcg (50 mcg Intravenous Given 06/30/23 1223)  ondansetron  (ZOFRAN ) injection 4 mg (4 mg Intravenous Given 06/30/23 1222)  methylPREDNISolone sodium succinate (SOLU-MEDROL) 40 mg/mL injection 40 mg (40 mg Intravenous Given 06/30/23 1229)  diphenhydrAMINE (BENADRYL) capsule 50 mg ( Oral See Alternative 06/30/23 1608)    Or  diphenhydrAMINE (BENADRYL) injection 50 mg (50 mg Intravenous Given 06/30/23 1608)  morphine  (PF) 4 MG/ML injection 4 mg (4 mg Intravenous Given 06/30/23 1530)  iohexol (OMNIPAQUE) 300 MG/ML solution 75 mL (75 mLs Intravenous Contrast Given 06/30/23 1743)  ondansetron  (ZOFRAN ) injection 4 mg (4 mg Intravenous Given 06/30/23 1755)      IMPRESSION / MDM / ASSESSMENT AND PLAN / ED COURSE   I have reviewed the triage note.  Differential diagnosis includes, but is not limited to, dental abscess, periorbital abscess, septal abscess, CVA  Patient's presentation is most consistent with acute presentation with potential threat to life or bodily function.  53 year old female presenting to the emergency department for treatment and evaluation of left-sided facial swelling that she noted upon awakening yesterday morning.  See HPI for further details.  Exam is most consistent with dental infection.  Neurological exam is without focal findings.  Plan will be to get a CT of the head without contrast and the CT maxillofacial with contrast.  Patient aware and agreeable to the plan.  Clinical Course as of 06/30/23 1856  Paulene Boron Jun 30, 2023  1228 Due to iodine allergy, contrast allergy protocol will be necessary. Patient aware of the 4 hour delay and is agreeable to stay. Pain medication requested and ordered.  CBC and CMP are without acute concerns. Vital signs are stable and without indication of sepsis. [CT]  1444 Patient reassessed. Facial edema unchanged. Voice remains clear. Patient ambulating to and from restroom without ataxia or assistance. [CT]  1853 CT head without contrast is negative for any acute concerns. CT maxillofacial with contrast shows no fluid collection but soft tissue edema consistent with cellulitis.  IV Rocephin infusing. Upon reassessment, facial swelling has not increased.  No change in assessment.  Results discussed with the patient.  We discussed plan for discharge home on antibiotics.  She was advised to see her primary care provider if she is not improving over the next 2 to 3 days.  She is to return to the emergency department at any point if her symptoms change or worsen.  She is comfortable and agreeable to the plan. [CT]    Clinical Course User Index [CT] Godric Lavell B, FNP     FINAL CLINICAL  IMPRESSION(S) / ED DIAGNOSES   Final diagnoses:  Facial cellulitis     Rx / DC Orders   ED Discharge Orders          Ordered    sulfamethoxazole -trimethoprim  (BACTRIM  DS) 800-160 MG tablet  2 times daily        06/30/23 1850    HYDROcodone -acetaminophen  (NORCO/VICODIN) 5-325 MG tablet  Every 6 hours PRN        06/30/23 1850             Note:  This document was prepared using Dragon voice recognition software and may include unintentional dictation errors.   Sherryle Don, FNP 06/30/23 [phone]  Arline Bennett, MD 06/30/23 [phone]
